# Patient Record
Sex: Female | Born: 1980 | Race: Black or African American | Hispanic: No | Marital: Single | Smoking: Never smoker
Health system: Southern US, Community
[De-identification: ages and names within clinical notes are randomized; demographics above are authoritative.]

## PROBLEM LIST (undated history)

## (undated) DIAGNOSIS — M1990 Unspecified osteoarthritis, unspecified site: Secondary | ICD-10-CM

## (undated) DIAGNOSIS — D649 Anemia, unspecified: Secondary | ICD-10-CM

## (undated) DIAGNOSIS — K219 Gastro-esophageal reflux disease without esophagitis: Secondary | ICD-10-CM

## (undated) DIAGNOSIS — F32A Depression, unspecified: Secondary | ICD-10-CM

## (undated) DIAGNOSIS — F419 Anxiety disorder, unspecified: Secondary | ICD-10-CM

## (undated) DIAGNOSIS — R011 Cardiac murmur, unspecified: Secondary | ICD-10-CM

## (undated) HISTORY — DX: Anemia, unspecified: D64.9

## (undated) HISTORY — DX: Depression, unspecified: F32.A

## (undated) HISTORY — DX: Gastro-esophageal reflux disease without esophagitis: K21.9

## (undated) HISTORY — DX: Cardiac murmur, unspecified: R01.1

## (undated) HISTORY — DX: Unspecified osteoarthritis, unspecified site: M19.90

## (undated) HISTORY — DX: Anxiety disorder, unspecified: F41.9

---
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
## 2016-06-14 HISTORY — PX: WRIST SURGERY: SHX841

## 2019-08-28 ENCOUNTER — Emergency Department (HOSPITAL_BASED_OUTPATIENT_CLINIC_OR_DEPARTMENT_OTHER): Payer: BC Managed Care – PPO

## 2019-08-28 ENCOUNTER — Encounter (HOSPITAL_BASED_OUTPATIENT_CLINIC_OR_DEPARTMENT_OTHER): Payer: Self-pay

## 2019-08-28 ENCOUNTER — Other Ambulatory Visit: Payer: Self-pay

## 2019-08-28 ENCOUNTER — Emergency Department (HOSPITAL_BASED_OUTPATIENT_CLINIC_OR_DEPARTMENT_OTHER)
Admission: EM | Admit: 2019-08-28 | Discharge: 2019-08-28 | Disposition: A | Discharge status: Home / Self Care | Payer: BC Managed Care – PPO | Attending: Emergency Medicine | Admitting: Emergency Medicine

## 2019-08-28 DIAGNOSIS — Y999 Unspecified external cause status: Secondary | ICD-10-CM | POA: Insufficient documentation

## 2019-08-28 DIAGNOSIS — Y939 Activity, unspecified: Secondary | ICD-10-CM | POA: Insufficient documentation

## 2019-08-28 DIAGNOSIS — Y92410 Unspecified street and highway as the place of occurrence of the external cause: Secondary | ICD-10-CM | POA: Insufficient documentation

## 2019-08-28 DIAGNOSIS — M7918 Myalgia, other site: Secondary | ICD-10-CM | POA: Insufficient documentation

## 2019-08-28 DIAGNOSIS — R519 Headache, unspecified: Secondary | ICD-10-CM | POA: Insufficient documentation

## 2019-08-28 DIAGNOSIS — M25512 Pain in left shoulder: Secondary | ICD-10-CM | POA: Diagnosis present

## 2019-08-28 MED ORDER — IBUPROFEN 800 MG PO TABS
800.0000 mg | ORAL_TABLET | Freq: Once | ORAL | Status: AC
  Administered 2019-08-28: 800 mg via ORAL
  Filled 2019-08-28: qty 1

## 2019-08-28 MED ORDER — METHOCARBAMOL 500 MG PO TABS
500.0000 mg | ORAL_TABLET | Freq: Three times a day (TID) | ORAL | 0 refills | Status: AC | PRN
Start: 2019-08-28 — End: ?
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
## 2019-08-28 NOTE — Discharge Instructions (Signed)
Follow up with sports medicine if the pain in the left upper extremity does not improve.
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
## 2021-01-15 DIAGNOSIS — L409 Psoriasis, unspecified: Secondary | ICD-10-CM | POA: Insufficient documentation

## 2021-04-07 ENCOUNTER — Encounter: Payer: Self-pay | Admitting: Physician Assistant

## 2021-04-18 DIAGNOSIS — B009 Herpesviral infection, unspecified: Secondary | ICD-10-CM | POA: Insufficient documentation

## 2021-04-22 ENCOUNTER — Ambulatory Visit: Payer: BC Managed Care – PPO | Admitting: Physician Assistant

## 2021-04-22 ENCOUNTER — Other Ambulatory Visit (INDEPENDENT_AMBULATORY_CARE_PROVIDER_SITE_OTHER): Payer: BC Managed Care – PPO

## 2021-04-22 ENCOUNTER — Encounter: Payer: Self-pay | Admitting: Physician Assistant

## 2021-04-22 VITALS — BP 118/78 | HR 71 | Resp 18 | Ht 62.0 in | Wt 154.2 lb

## 2021-04-22 DIAGNOSIS — R1030 Lower abdominal pain, unspecified: Secondary | ICD-10-CM

## 2021-04-22 DIAGNOSIS — R195 Other fecal abnormalities: Secondary | ICD-10-CM

## 2021-04-22 DIAGNOSIS — D509 Iron deficiency anemia, unspecified: Secondary | ICD-10-CM

## 2021-04-22 LAB — CBC WITH DIFFERENTIAL/PLATELET
Basophils Absolute: 0 10*3/uL (ref 0.0–0.1)
Basophils Relative: 0.6 % (ref 0.0–3.0)
Eosinophils Absolute: 0.1 10*3/uL (ref 0.0–0.7)
Eosinophils Relative: 2.5 % (ref 0.0–5.0)
HCT: 36 % (ref 36.0–46.0)
Hemoglobin: 11.7 g/dL — ABNORMAL LOW (ref 12.0–15.0)
Lymphocytes Relative: 40 % (ref 12.0–46.0)
Lymphs Abs: 1.5 10*3/uL (ref 0.7–4.0)
MCHC: 32.6 g/dL (ref 30.0–36.0)
MCV: 86.1 fl (ref 78.0–100.0)
Monocytes Absolute: 0.3 10*3/uL (ref 0.1–1.0)
Monocytes Relative: 7.4 % (ref 3.0–12.0)
Neutro Abs: 1.8 10*3/uL (ref 1.4–7.7)
Neutrophils Relative %: 49.5 % (ref 43.0–77.0)
Platelets: 309 10*3/uL (ref 150.0–400.0)
RBC: 4.18 Mil/uL (ref 3.87–5.11)
RDW: 15.2 % (ref 11.5–15.5)
WBC: 3.7 10*3/uL — ABNORMAL LOW (ref 4.0–10.5)

## 2021-04-22 LAB — BASIC METABOLIC PANEL
BUN: 11 mg/dL (ref 6–23)
CO2: 32 mEq/L (ref 19–32)
Calcium: 9.2 mg/dL (ref 8.4–10.5)
Chloride: 104 mEq/L (ref 96–112)
Creatinine, Ser: 0.55 mg/dL (ref 0.40–1.20)
GFR: 112.95 mL/min (ref >60.00)
Glucose, Bld: 91 mg/dL (ref 70–99)
Potassium: 3.8 mEq/L (ref 3.5–5.1)
Sodium: 140 mEq/L (ref 135–145)

## 2021-04-22 MED ORDER — OMEPRAZOLE 40 MG PO CPDR: 40.0000 mg | DELAYED_RELEASE_CAPSULE | Freq: Every day | ORAL | 3 refills | Status: DC

## 2021-04-22 MED ORDER — PLENVU 140 G PO SOLR: 1.0000 | ORAL | 0 refills | Status: DC

## 2021-04-22 NOTE — Patient Instructions (Addendum)
If you are age 43 or younger, your body mass index should be between 19-25. Your Body mass index is 28.2 kg/m. If this is out of the aformentioned range listed, please consider follow up with your Primary Care Provider.  ________________________________________________________  The [facility] providers would like to encourage you to use [facility] to communicate with providers for non-urgent requests or questions.  Due to long hold times on the telephone, sending your provider a message by [facility] may be a faster and more efficient way to get a response.  Please allow 48 business hours for a response.  Please remember that this is for non-urgent requests.  _______________________________________________________  Your provider has requested that you go to the basement level for lab work before leaving today. Press "B" on the elevator. The lab is located at the first door on the left as you exit the elevator.  You have been scheduled for an endoscopy and colonoscopy. Please follow the written instructions given to you at your visit today. Please pick up your prep supplies at the pharmacy within the next 1-3 days. If you use inhalers (even only as needed), please bring them with you on the day of your procedure.  You have been scheduled for a CT scan of the abdomen and pelvis at [place] ([address]---this is in the same building as [employer]).   You are scheduled on 05/02/2021 at 8:30 am. You should arrive 15 minutes prior to your appointment time for registration. Please follow the written instructions below on the day of your exam:  WARNING: IF YOU ARE ALLERGIC TO IODINE/X-RAY DYE, PLEASE NOTIFY RADIOLOGY IMMEDIATELY AT [phone]! YOU WILL BE GIVEN A 13 HOUR PREMEDICATION PREP.  1) Do not eat anything after 4:30 am (4 hours prior to your test) 2) You have been given 2 bottles of oral contrast to drink. The solution may taste better if refrigerated, but do NOT add  ice or any other liquid to this solution. Shake well before drinking.    Drink 1 bottle of contrast @ 6:30 am (2 hours prior to your exam)  Drink 1 bottle of contrast @ 7:30 am (1 hour prior to your exam)  You may take any medications as prescribed with a small amount of water, if necessary. If you take any of the following medications: METFORMIN, GLUCOPHAGE, GLUCOVANCE, AVANDAMET, RIOMET, FORTAMET, [place] MET, JANUMET, GLUMETZA or METAGLIP, you MAY be asked to HOLD this medication 48 hours AFTER the exam.  The purpose of you drinking the oral contrast is to aid in the visualization of your intestinal tract. The contrast solution may cause some diarrhea. Depending on your individual set of symptoms, you may also receive an intravenous injection of x-ray contrast/dye. Plan on being at [facility] for 30 minutes or longer, depending on the type of exam you are having performed.  This test typically takes 30-45 minutes to complete.  If you have any questions regarding your exam or if you need to reschedule, you may call the CT department at [phone] between the hours of 8:00 am and 5:00 pm, Monday-Friday.  ____________________________________________________________  START Omeprazole 40 mg 1 capsule before breakfast.  Follow up pending the results of your Ct, Endoscopy/Colonoscopy.  Thank you for entrusting me with your care and choosing [facility].  Amy Esterwood, PA-C

## 2021-04-23 ENCOUNTER — Encounter: Payer: Self-pay | Admitting: Physician Assistant

## 2021-04-23 NOTE — Progress Notes (Signed)
Attending Physician's Attestation   I have reviewed the chart.   I agree with the Advanced Practitioner's note, impression, and recommendations with any updates as below.    Ariann Khaimov Mansouraty, MD [facility] Advanced [facility]oscopy Office # [phone]  

## 2021-04-23 NOTE — Progress Notes (Signed)
Subjective:    Patient ID: Dawn Brooks, female    DOB: [birth year], 41 y.o.   MRN: [id number]  HPI Dawn Brooks is a 41 year old African-American female, new to GI today referred by [facility] for evaluation of iron deficiency anemia and lower abdominal pain. Patient has not had any prior GI evaluation. She says she has been intermittently anemic in the past, perhaps iron deficient since she delivered a child in 2016.  She has not had issues with menorrhagia, and says that her menses have been normal, usually last for 4 to 5 days and are not obviously heavy though perhaps a little bit heavier over the past 5 or 6 years. She had labs done 04/08/2021 showing ferritin of 9/serum iron 69/iron sat 116/TIBC 434 Hemoglobin 11.5/hematocrit 35/MCV of 81. She has started on ferrous sulfate 325 mg twice daily. She is complaining of fatigue over the past couple of months which she says is pretty severe, she has had some episodes of lightheadedness and says she just does not feel well. She has been having mid lower abdominal pain which she describes as a shooting pain at times that feels like it shoots into her pelvis/uterus.  She has noticed perhaps some positional exacerbation with turning etc.  She is describing cramping and spasms over the past 3 weeks intermittently of varying intensity.  She has not noticed any changes with p.o. intake, appetite has been okay, weight stable, no fever or chills. She did have some black stools about a month ago just noted on 1 day and then recurred for 1 other day at which point she says stool was very dark green.  Since then has been having regular bowel movements. She has had a prescription for meloxicam but says she has not been taking that, may take occasional NSAID. Family history negative for GI disease as far she is aware. No recent abdominal imaging  Other medical problems include lumbar spondylosis, she status post C-section.  Review of  Systems Pertinent positive and negative review of systems were noted in the above HPI section.  All other review of systems was otherwise negative.   Outpatient Encounter Medications as of 04/22/2021  Medication Sig   Ascorbic Acid (VITAMIN C) 1000 MG tablet Take 1,000 mg by mouth daily.   Cholecalciferol 25 MCG (1000 UT) tablet Take by mouth.   Cranberry Fruit 465 MG CAPS Take by mouth.   ferrous sulfate 325 (65 FE) MG tablet Take 1 tablet by mouth daily.   lidocaine (LIDODERM) 5 % Place 1 patch onto the skin daily. Remove & Discard patch within 12 hours or as directed by MD   Multiple Vitamin (MULTI-VITAMIN) tablet Take by mouth.   omeprazole (PRILOSEC) 40 MG capsule Take 1 capsule (40 mg total) by mouth daily before breakfast.   PEG-KCl-NaCl-NaSulf-Na Asc-C (PLENVU) 140 g SOLR Take 1 kit by mouth as directed.   doxycycline (DORYX) 100 MG EC tablet Take 100 mg by mouth 2 (two) times daily. (Patient not taking: Reported on 04/22/2021)   Fluocinolone Acetonide Scalp 0.01 % OIL Apply topically as needed.   meloxicam (MOBIC) 15 MG tablet Take 15 mg by mouth daily. (Patient not taking: Reported on 04/22/2021)   methocarbamol (ROBAXIN) 500 MG tablet Take 1 tablet (500 mg total) by mouth every 8 (eight) hours as needed for muscle spasms. (Patient not taking: Reported on 04/22/2021)   norethindrone-ethinyl estradiol (LOESTRIN) 1-20 MG-MCG tablet Take 1 tablet by mouth daily. (Patient not taking: Reported on 04/22/2021)  norethindrone-ethinyl estradiol-FE (LOESTRIN FE) 1-20 MG-MCG tablet Take 1 tablet by mouth daily. (Patient not taking: Reported on 04/22/2021)   valACYclovir (VALTREX) 500 MG tablet Take by mouth. (Patient not taking: Reported on 04/22/2021)   No facility-administered encounter medications on file as of 04/22/2021.   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   Social History   Socioeconomic History    Marital status: Single    Spouse name: Not on file   Number of children: Not on file   Years of education: Not on file   Highest education level: Not on file  Occupational History   Not on file  Tobacco Use   Smoking status: Never   Smokeless tobacco: Never  Vaping Use   Vaping Use: Never used  Substance and Sexual Activity   Alcohol use: Never   Drug use: Not Currently    Types: Marijuana   Sexual activity: Not on file  Other Topics Concern   Not on file  Social History Narrative   Not on file   Social Determinants of Health   Financial Resource Strain: Not on file  Food Insecurity: Not on file  Transportation Needs: Not on file  Physical Activity: Not on file  Stress: Not on file  Social Connections: Not on file  Intimate Partner Violence: Not on file    Dawn Brooks's family history includes Breast cancer in her paternal aunt; Diabetes in her maternal grandfather and maternal grandmother; Prostate cancer in her father.      Objective:    Vitals:   04/22/21 0848  BP: 118/78  Pulse: 71  Resp: 18  SpO2: 99%    Physical Exam Well-developed well-nourished AA in no acute distress.  Height, Weight, 154 BMI 28.2  HEENT; nontraumatic normocephalic, EOMI, PE R LA, sclera anicteric. Oropharynx; not examined today Neck; supple, no JVD Cardiovascular; regular rate and rhythm with S1-S2, no murmur rub or gallop Pulmonary; Clear bilaterally Abdomen; soft, nontender, nondistended, no palpable mass or hepatosplenomegaly, bowel sounds are active Rectal; not done today Skin; benign exam, no jaundice rash or appreciable lesions Extremities; no clubbing cyanosis or edema skin warm and dry Neuro/Psych; alert and oriented x4, grossly nonfocal mood and affect appropriate        Assessment & Plan:   #[age] year old female, with iron deficiency anemia, 3 to 4-week history of lower abdominal pain/cramping and reported history of black stool on 2 occasions about 1 month ago with  normalization since.  History concerning for occult GI blood loss and 1 episode of more overt blood loss about 1 month ago.  Rule out occult colonic neoplasm, IBD, consider chronic gastropathy.  Plan; Patient will be scheduled for colonoscopy and EGD with Dr. Lia Foyer   Both procedures were discussed in detail with the patient including indications risk and benefits and she is agreeable to proceed Will also proceed with CT of the abdomen and pelvis with contrast.  This will be done within the next week, prior to planned procedures. Continue ferrous sulfate 325 mg p.o. twice daily We will add prophylactic omeprazole 40 mg p.o. every morning. Patient asked for a note to be out of work until procedures can be completed.   Gervase Colberg S Telissa Palmisano PA-C 04/23/2021   Cc: Nicola Girt, DO

## 2021-05-01 ENCOUNTER — Emergency Department (HOSPITAL_COMMUNITY)
Admission: EM | Admit: 2021-05-01 | Discharge: 2021-05-02 | Disposition: A | Discharge status: Home / Self Care | Payer: BC Managed Care – PPO | Attending: Emergency Medicine | Admitting: Emergency Medicine

## 2021-05-01 ENCOUNTER — Emergency Department (HOSPITAL_COMMUNITY): Payer: BC Managed Care – PPO

## 2021-05-01 DIAGNOSIS — R202 Paresthesia of skin: Secondary | ICD-10-CM | POA: Insufficient documentation

## 2021-05-01 DIAGNOSIS — R0789 Other chest pain: Secondary | ICD-10-CM | POA: Diagnosis not present

## 2021-05-01 DIAGNOSIS — R079 Chest pain, unspecified: Secondary | ICD-10-CM

## 2021-05-01 DIAGNOSIS — N9489 Other specified conditions associated with female genital organs and menstrual cycle: Secondary | ICD-10-CM | POA: Diagnosis not present

## 2021-05-01 LAB — CBC
HCT: 34.9 % — ABNORMAL LOW (ref 36.0–46.0)
Hemoglobin: 10.9 g/dL — ABNORMAL LOW (ref 12.0–15.0)
MCH: 27.5 pg (ref 26.0–34.0)
MCHC: 31.2 g/dL (ref 30.0–36.0)
MCV: 88.1 fL (ref 80.0–100.0)
Platelets: 293 10*3/uL (ref 150–400)
RBC: 3.96 MIL/uL (ref 3.87–5.11)
RDW: 14 % (ref 11.5–15.5)
WBC: 4.1 10*3/uL (ref 4.0–10.5)
nRBC: 0 % (ref 0.0–0.2)

## 2021-05-01 LAB — BASIC METABOLIC PANEL
Anion gap: 6 (ref 5–15)
BUN: 6 mg/dL (ref 6–20)
CO2: 27 mmol/L (ref 22–32)
Calcium: 8.7 mg/dL — ABNORMAL LOW (ref 8.9–10.3)
Chloride: 105 mmol/L (ref 98–111)
Creatinine, Ser: 0.7 mg/dL (ref 0.44–1.00)
GFR, Estimated: >60 mL/min (ref >60)
Glucose, Bld: 111 mg/dL — ABNORMAL HIGH (ref 70–99)
Potassium: 4 mmol/L (ref 3.5–5.1)
Sodium: 138 mmol/L (ref 135–145)

## 2021-05-01 LAB — I-STAT BETA HCG BLOOD, ED (MC, WL, AP ONLY): I-stat hCG, quantitative: <5 m[IU]/mL (ref <5)

## 2021-05-01 LAB — TROPONIN I (HIGH SENSITIVITY): Troponin I (High Sensitivity): 2 ng/L (ref <18)

## 2021-05-01 NOTE — ED Triage Notes (Signed)
Pt c/o intermittent CP in the R side that has "shifted" to the L side in the last week. Also c/o R hand tingling onset today, states it's started to go up in the arm as the day progressed. Hx "digestive issues," CT, endoscopy, colonoscopy scheduled. Endorses intermittent episodes of dizziness, nausea.

## 2021-05-02 ENCOUNTER — Other Ambulatory Visit: Payer: Self-pay

## 2021-05-02 ENCOUNTER — Ambulatory Visit (INDEPENDENT_AMBULATORY_CARE_PROVIDER_SITE_OTHER)
Admission: RE | Admit: 2021-05-02 | Discharge: 2021-05-02 | Disposition: A | Discharge status: Home / Self Care | Payer: BC Managed Care – PPO | Source: Ambulatory Visit | Attending: Physician Assistant | Admitting: Physician Assistant

## 2021-05-02 DIAGNOSIS — D509 Iron deficiency anemia, unspecified: Secondary | ICD-10-CM | POA: Diagnosis not present

## 2021-05-02 DIAGNOSIS — R195 Other fecal abnormalities: Secondary | ICD-10-CM | POA: Diagnosis not present

## 2021-05-02 DIAGNOSIS — R1030 Lower abdominal pain, unspecified: Secondary | ICD-10-CM

## 2021-05-02 LAB — TROPONIN I (HIGH SENSITIVITY): Troponin I (High Sensitivity): 3 ng/L (ref <18)

## 2021-05-02 MED ORDER — IOHEXOL 300 MG/ML  SOLN
100.0000 mL | Freq: Once | INTRAMUSCULAR | Status: AC | PRN
  Administered 2021-05-02: 100 mL via INTRAVENOUS

## 2021-05-02 NOTE — ED Provider Notes (Signed)
[facility] EMERGENCY DEPARTMENT Provider Note   CSN: [id number] Arrival date & time: 05/01/21  2143     History  Chief Complaint  Patient presents with   Chest Pain    R sided    Dawn Brooks is a 41 y.o. female. The patient presents to the ED with complaints of left sided chest pain and right hand paresthesias. The patient states that she has had right sided chest pain for the past 1.5 months. She states that earlier this week the pain migrated to the left side of her chest. Today she experienced some pain and tingling in her right hand. She was concerned about possible cardiac issues due to the right hand involvement. PMH significant for iron deficiency anemia, de Quervain's tendinitis (right wrist) with release in 2018, DDD (lumbar).    HPI     Home Medications Prior to Admission medications   Medication Sig Start Date End Date Taking? Authorizing Provider  Ascorbic Acid (VITAMIN C) 1000 MG tablet Take 1,000 mg by mouth daily.   Yes [provider]  Cholecalciferol 25 MCG (1000 UT) tablet Take 1,000 Units by mouth daily.   Yes [provider]  Cranberry Fruit 465 MG CAPS Take 465 mg by mouth daily.   Yes [provider]  ferrous sulfate 325 (65 FE) MG tablet Take 325 mg by mouth 2 (two) times daily with a meal.   Yes [provider]  Fluocinolone Acetonide Scalp 0.01 % OIL Apply 1 application topically as needed (itching). 12/18/20  Yes [provider]  ibuprofen (ADVIL) 200 MG tablet Take 400 mg by mouth every 6 (six) hours as needed for headache or moderate pain.   Yes [provider]  lidocaine (LIDODERM) 5 % Place 1 patch onto the skin daily as needed (pain). Remove & Discard patch within 12 hours or as directed by MD   Yes [provider]  Multiple Vitamin (MULTI-VITAMIN) tablet Take 1 tablet by mouth daily.   Yes [provider]  omeprazole (PRILOSEC) 40 MG capsule Take 1 capsule (40  mg total) by mouth daily before breakfast. 04/22/21  Yes Esterwood, Amy S, PA-C  valACYclovir (VALTREX) 500 MG tablet Take 500 mg by mouth daily as needed (outbreaks). 04/20/16  Yes [provider]  methocarbamol (ROBAXIN) 500 MG tablet Take 1 tablet (500 mg total) by mouth every 8 (eight) hours as needed for muscle spasms. Patient not taking: Reported on 04/22/2021 08/28/19   Davonna Belling, MD  PEG-KCl-NaCl-NaSulf-Na Asc-C (PLENVU) 140 g SOLR Take 1 kit by mouth as directed. 04/22/21   Esterwood, Amy S, PA-C      Allergies    Patient has no known allergies.    Review of Systems   Review of Systems  Constitutional:  Negative for fever.  Respiratory:  Negative for shortness of breath.   Cardiovascular:  Positive for chest pain. Negative for palpitations.  Gastrointestinal:  Negative for abdominal pain, nausea and vomiting.  Musculoskeletal:        Right hand pain  Neurological:  Positive for numbness (Intermittent right hand numbness).   Physical Exam Updated Vital Signs BP 116/66 (BP Location: Right Arm)    Pulse 70    Temp 98.9 F (37.2 C) (Oral)    Resp 18    LMP 04/18/2021    SpO2 97%  Physical Exam Constitutional:      General: She is not in acute distress. HENT:     Head: Normocephalic.  Cardiovascular:  Rate and Rhythm: Normal rate and regular rhythm.     Heart sounds: Normal heart sounds.  Pulmonary:     Effort: Pulmonary effort is normal. No respiratory distress.     Breath sounds: Normal breath sounds.  Chest:     Chest wall: No deformity, tenderness or crepitus.  Abdominal:     Palpations: Abdomen is soft.     Tenderness: There is no abdominal tenderness.  Musculoskeletal:     Right hand: Normal strength.     Cervical back: Normal range of motion.     Comments: Intermittent paresthesias right hand  Skin:    General: Skin is warm and dry.     Coloration: Skin is not pale.  Neurological:     Mental Status: She is alert.    ED Results / Procedures /  Treatments   Labs (all labs ordered are listed, but only abnormal results are displayed) Labs Reviewed  BASIC METABOLIC PANEL - Abnormal; Notable for the following components:      Result Value   Glucose, Bld 111 (*)    Calcium 8.7 (*)    All other components within normal limits  CBC - Abnormal; Notable for the following components:   Hemoglobin 10.9 (*)    HCT 34.9 (*)    All other components within normal limits  I-STAT BETA HCG BLOOD, ED ([facility], [facility], [facility] ONLY)  TROPONIN I (HIGH SENSITIVITY)  TROPONIN I (HIGH SENSITIVITY)    EKG EKG Interpretation  Date/Time:  Thursday May 01 2021 22:08:13 EST Ventricular Rate:  82 PR Interval:  146 QRS Duration: 84 QT Interval:  374 QTC Calculation: 436 R Axis:   25 Text Interpretation: Normal sinus rhythm Normal ECG No previous ECGs available Confirmed by Addison Lank [phone]) on 05/02/2021 3:05:34 AM  Radiology DG Chest 2 View  Result Date: 05/01/2021 CLINICAL DATA:  Chest pain. EXAM: CHEST - 2 VIEW COMPARISON:  None. FINDINGS: The heart size and mediastinal contours are within normal limits. Mild airspace disease is noted in the infrahilar region on the right. No consolidation, effusion, or pneumothorax. No acute osseous abnormality. IMPRESSION: Mild infrahilar airspace disease on the right, possible atelectasis versus developing infiltrate. Electronically Signed   By: Brett Fairy M.D.   On: 05/01/2021 22:26    Procedures Procedures    Medications Ordered in ED Medications - No data to display  ED Course/ Medical Decision Making/ A&P                           Medical Decision Making Amount and/or Complexity of Data Reviewed Labs: ordered. Radiology: ordered.   This patient presents to the ED for concern of left sided chest pain, this involves an extensive number of treatment options, and is a complaint that carries with it a high risk of complications and morbidity.  The differential diagnosis includes but is not limited to  ACS, dissection, PE, cholecystitis, myocarditis, biliary colic, pleuritis, muscle sprain   Co morbidities that complicate the patient evaluation  Iron deficiency anemia   Additional history obtained:   External records from outside source obtained and reviewed including office note from 04/22/2021   Lab Tests:  I Ordered, and personally interpreted labs.  The pertinent results include:  Troponin 3.    Imaging Studies ordered:  I ordered imaging studies including chest x-ray  I independently visualized and interpreted imaging which showed mild infrahilar airspace disease on the right I agree with the radiologist interpretation  Cardiac Monitoring:  The patient was maintained on a cardiac monitor.  I personally viewed and interpreted the cardiac monitored which showed an underlying rhythm of: Normal sinus rhythm   Medicines ordered and prescription drug management:   I have reviewed the patients home medicines and have made adjustments as needed   Reevaluation:  After the interventions noted above, I reevaluated the patient and found that they have :improved    Dispostion:  After consideration of the diagnostic results and the patients response to treatment, I feel that the patent would benefit from discharge home. Troponin of 3 shows no sign of heart stress. EKG shows no ischemic changes. The patient states that the chest pain is positional which makes ACS highly unlikely. She is having no shortness of breath and is not tachycardic. I believe that the chest pain is likely musculoskeletal in nature. Strict return precautions provided. I recommend follow up with the patient's PCP.  The patient voiced understanding   Final Clinical Impression(s) / ED Diagnoses Final diagnoses:  Chest pain, unspecified type    Rx / DC Orders ED Discharge Orders     None         Dorothyann Peng, [place] 05/02/21 0533    Fatima Blank, MD 05/02/21 [phone]

## 2021-05-02 NOTE — Discharge Instructions (Addendum)
You were seen today for chest pain.  Lab values showed no signs of heart injury.  Your EKG showed no signs of ischemia or damage to your heart.  I recommend follow-up with your primary care provider for your continued chest wall pain.  You should return to the emergency department if you have increasing chest pain, shortness of breath, altered level of consciousness, or other life threats.

## 2021-05-08 ENCOUNTER — Ambulatory Visit (AMBULATORY_SURGERY_CENTER): Payer: BC Managed Care – PPO | Admitting: Gastroenterology

## 2021-05-08 ENCOUNTER — Other Ambulatory Visit: Payer: Self-pay

## 2021-05-08 ENCOUNTER — Encounter: Payer: Self-pay | Admitting: Gastroenterology

## 2021-05-08 VITALS — BP 114/69 | HR 77 | Temp 97.9°F | Resp 13 | Ht 62.0 in | Wt 154.0 lb

## 2021-05-08 DIAGNOSIS — R195 Other fecal abnormalities: Secondary | ICD-10-CM

## 2021-05-08 DIAGNOSIS — K640 First degree hemorrhoids: Secondary | ICD-10-CM | POA: Diagnosis not present

## 2021-05-08 DIAGNOSIS — K5730 Diverticulosis of large intestine without perforation or abscess without bleeding: Secondary | ICD-10-CM | POA: Diagnosis not present

## 2021-05-08 DIAGNOSIS — K2970 Gastritis, unspecified, without bleeding: Secondary | ICD-10-CM

## 2021-05-08 DIAGNOSIS — K317 Polyp of stomach and duodenum: Secondary | ICD-10-CM | POA: Diagnosis not present

## 2021-05-08 DIAGNOSIS — R1030 Lower abdominal pain, unspecified: Secondary | ICD-10-CM | POA: Diagnosis not present

## 2021-05-08 DIAGNOSIS — D509 Iron deficiency anemia, unspecified: Secondary | ICD-10-CM

## 2021-05-08 DIAGNOSIS — R12 Heartburn: Secondary | ICD-10-CM

## 2021-05-08 DIAGNOSIS — K449 Diaphragmatic hernia without obstruction or gangrene: Secondary | ICD-10-CM | POA: Diagnosis not present

## 2021-05-08 DIAGNOSIS — K635 Polyp of colon: Secondary | ICD-10-CM | POA: Diagnosis not present

## 2021-05-08 DIAGNOSIS — D125 Benign neoplasm of sigmoid colon: Secondary | ICD-10-CM

## 2021-05-08 DIAGNOSIS — D123 Benign neoplasm of transverse colon: Secondary | ICD-10-CM

## 2021-05-08 DIAGNOSIS — D122 Benign neoplasm of ascending colon: Secondary | ICD-10-CM

## 2021-05-08 MED ORDER — SODIUM CHLORIDE 0.9 % IV SOLN: 500.0000 mL | Freq: Once | INTRAVENOUS | Status: DC

## 2021-05-08 NOTE — Progress Notes (Signed)
Called to room to assist during endoscopic procedure.  Patient ID and intended procedure confirmed with present staff. Received instructions for my participation in the procedure from the performing physician.  

## 2021-05-08 NOTE — Op Note (Signed)
[facility] Patient Name: Dawn Brooks Procedure Date: 05/08/2021 2:46 PM MRN: [id number] Endoscopist: Justice Britain , MD Age: 41 Referring MD:  Date of Birth: [birth year] Gender: Female Account #: [id number] Procedure:                Colonoscopy Indications:              Lower abdominal pain, Iron deficiency anemia Medicines:                Monitored Anesthesia Care Procedure:                Pre-Anesthesia Assessment:                           - Prior to the procedure, a History and Physical                            was performed, and patient medications and                            allergies were reviewed. The patient's tolerance of                            previous anesthesia was also reviewed. The risks                            and benefits of the procedure and the sedation                            options and risks were discussed with the patient.                            All questions were answered, and informed consent                            was obtained. Prior Anticoagulants: The patient has                            taken no previous anticoagulant or antiplatelet                            agents. ASA Grade Assessment: II - A patient with                            mild systemic disease. After reviewing the risks                            and benefits, the patient was deemed in                            satisfactory condition to undergo the procedure.                           After obtaining informed consent, the colonoscope  was passed under direct vision. Throughout the                            procedure, the patient's blood pressure, pulse, and                            oxygen saturations were monitored continuously. The                            Olympus PCF-H190DL (MW#[id number]) Colonoscope was                            introduced through the anus and advanced to the 5                            cm into the  ileum. The colonoscopy was performed                            without difficulty. The patient tolerated the                            procedure. The quality of the bowel preparation was                            good. The terminal ileum, ileocecal valve,                            appendiceal orifice, and rectum were photographed. Scope In: 3:06:55 PM Scope Out: 3:21:19 PM Scope Withdrawal Time: 0 hours 12 minutes 6 seconds  Total Procedure Duration: 0 hours 14 minutes 24 seconds  Findings:                 The digital rectal exam was normal. Pertinent                            negatives include no palpable rectal lesions.                           The terminal ileum and ileocecal valve appeared                            normal.                           Four sessile polyps were found in the sigmoid colon                            (1), transverse colon (2) and ascending colon (1).                            The polyps were 2 to 9 mm in size. These polyps                            were removed with a cold snare. Resection  and                            retrieval were complete.                           A single small-mouthed diverticulum was found in                            the cecum.                           Normal mucosa was found in the entire colon                            otherwise.                           Non-bleeding non-thrombosed internal hemorrhoids                            were found during retroflexion, during perianal                            exam and during digital exam. The hemorrhoids were                            Grade I (internal hemorrhoids that do not prolapse). Complications:            No immediate complications. Estimated Blood Loss:     Estimated blood loss was minimal. Impression:               - The examined portion of the ileum was normal.                           - Four 2 to 9 mm polyps in the sigmoid colon, in                             the transverse colon and in the ascending colon,                            removed with a cold snare. Resected and retrieved.                           - Diverticulosis in the cecum.                           - Normal mucosa in the entire examined colon                            otherwise.                           - Non-bleeding non-thrombosed internal hemorrhoids. Recommendation:           - The patient will be observed post-procedure,  until all discharge criteria are met.                           - Discharge patient to home.                           - Patient has a contact number available for                            emergencies. The signs and symptoms of potential                            delayed complications were discussed with the                            patient. Return to normal activities tomorrow.                            Written discharge instructions were provided to the                            patient.                           - High fiber diet.                           - Use FiberCon 1-2 tablets PO daily.                           - Continue present medications.                           - Await pathology results.                           - Repeat colonoscopy in 3/07/20/08 years for                            surveillance based on pathology results.                           - As patient is still having menstruation, IDA                            likely from this. If IDA persists after                            menstruation completes or if it becomes                            significantly progressive, can consider Video                            Capsule Endoscopy in future.                           -  The findings and recommendations were discussed                            with the patient.                           - The findings and recommendations were discussed                            with the patient's  family. Justice Britain, MD 05/08/2021 3:34:44 PM

## 2021-05-08 NOTE — Progress Notes (Signed)
Pt awake, report to RN, [facility]  °

## 2021-05-08 NOTE — Progress Notes (Signed)
GASTROENTEROLOGY PROCEDURE H&P NOTE   Primary Care Physician: Nicola Girt, DO  HPI: Dawn Brooks is a 41 y.o. female who presents for EGD/Colonoscopy to evaluation lower abdominal pain, IDA, dark stools.  Past Medical History:  Diagnosis Date   Anemia    Anxiety    Arthritis    Depression    GERD (gastroesophageal reflux disease)    Heart murmur    Past Surgical History:  Procedure Laterality Date   CESAREAN SECTION  2016   WISDOM TOOTH EXTRACTION  1998   WRIST SURGERY  06/14/2016   Current Outpatient Medications  Medication Sig Dispense Refill   Ascorbic Acid (VITAMIN C) 1000 MG tablet Take 1,000 mg by mouth daily.     Cholecalciferol 25 MCG (1000 UT) tablet Take 1,000 Units by mouth daily.     Cranberry Fruit 465 MG CAPS Take 465 mg by mouth daily.     ferrous sulfate 325 (65 FE) MG tablet Take 325 mg by mouth 2 (two) times daily with a meal.     Fluocinolone Acetonide Scalp 0.01 % OIL Apply 1 application topically as needed (itching).     lidocaine (LIDODERM) 5 % Place 1 patch onto the skin daily as needed (pain). Remove & Discard patch within 12 hours or as directed by MD     Multiple Vitamin (MULTI-VITAMIN) tablet Take 1 tablet by mouth daily.     omeprazole (PRILOSEC) 40 MG capsule Take 1 capsule (40 mg total) by mouth daily before breakfast. 30 capsule 3   valACYclovir (VALTREX) 500 MG tablet Take 500 mg by mouth daily as needed (outbreaks).     ibuprofen (ADVIL) 200 MG tablet Take 400 mg by mouth every 6 (six) hours as needed for headache or moderate pain.     methocarbamol (ROBAXIN) 500 MG tablet Take 1 tablet (500 mg total) by mouth every 8 (eight) hours as needed for muscle spasms. (Patient not taking: Reported on 04/22/2021) 8 tablet 0   Current Facility-Administered Medications  Medication Dose Route Frequency Provider Last Rate Last Admin   0.9 %  sodium chloride infusion  500 mL Intravenous Once Mansouraty, Telford Nab., MD        Current Outpatient  Medications:    Ascorbic Acid (VITAMIN C) 1000 MG tablet, Take 1,000 mg by mouth daily., Disp: , Rfl:    Cholecalciferol 25 MCG (1000 UT) tablet, Take 1,000 Units by mouth daily., Disp: , Rfl:    Cranberry Fruit 465 MG CAPS, Take 465 mg by mouth daily., Disp: , Rfl:    ferrous sulfate 325 (65 FE) MG tablet, Take 325 mg by mouth 2 (two) times daily with a meal., Disp: , Rfl:    Fluocinolone Acetonide Scalp 0.01 % OIL, Apply 1 application topically as needed (itching)., Disp: , Rfl:    lidocaine (LIDODERM) 5 %, Place 1 patch onto the skin daily as needed (pain). Remove & Discard patch within 12 hours or as directed by MD, Disp: , Rfl:    Multiple Vitamin (MULTI-VITAMIN) tablet, Take 1 tablet by mouth daily., Disp: , Rfl:    omeprazole (PRILOSEC) 40 MG capsule, Take 1 capsule (40 mg total) by mouth daily before breakfast., Disp: 30 capsule, Rfl: 3   valACYclovir (VALTREX) 500 MG tablet, Take 500 mg by mouth daily as needed (outbreaks)., Disp: , Rfl:    ibuprofen (ADVIL) 200 MG tablet, Take 400 mg by mouth every 6 (six) hours as needed for headache or moderate pain., Disp: , Rfl:    methocarbamol (  ROBAXIN) 500 MG tablet, Take 1 tablet (500 mg total) by mouth every 8 (eight) hours as needed for muscle spasms. (Patient not taking: Reported on 04/22/2021), Disp: 8 tablet, Rfl: 0  Current Facility-Administered Medications:    0.9 %  sodium chloride infusion, 500 mL, Intravenous, Once, Mansouraty, Telford Nab., MD No Known Allergies Family History  Problem Relation Age of Onset   Prostate cancer Father    Breast cancer Paternal Aunt    Diabetes Maternal Grandmother    Diabetes Maternal Grandfather    Colon cancer Neg Hx    Esophageal cancer Neg Hx    Liver cancer Neg Hx    Pancreatic cancer Neg Hx    Stomach cancer Neg Hx    Social History   Socioeconomic History   Marital status: Single    Spouse name: Not on file   Number of children: Not on file   Years of education: Not on file   Highest  education level: Not on file  Occupational History   Not on file  Tobacco Use   Smoking status: Never   Smokeless tobacco: Never  Vaping Use   Vaping Use: Never used  Substance and Sexual Activity   Alcohol use: Never   Drug use: Not Currently    Types: Marijuana   Sexual activity: Not on file  Other Topics Concern   Not on file  Social History Narrative   Not on file   Social Determinants of Health   Financial Resource Strain: Not on file  Food Insecurity: Not on file  Transportation Needs: Not on file  Physical Activity: Not on file  Stress: Not on file  Social Connections: Not on file  Intimate Partner Violence: Not on file    Physical Exam: Today's Vitals   05/08/21 1415  BP: (!) 158/78  Pulse: 76  Temp: 97.9 F (36.6 C)  SpO2: 98%  Weight: 154 lb (69.9 kg)  Height: 5\' 2"  (1.575 m)   Body mass index is 28.17 kg/m. GEN: NAD EYE: Sclerae anicteric ENT: MMM CV: Non-tachycardic GI: Soft, NT/ND NEURO:  Alert & Oriented x 3  Lab Results: No results for input(s): WBC, HGB, HCT, PLT in the last 72 hours. BMET No results for input(s): NA, K, CL, CO2, GLUCOSE, BUN, CREATININE, CALCIUM in the last 72 hours. LFT No results for input(s): PROT, ALBUMIN, AST, ALT, ALKPHOS, BILITOT, BILIDIR, IBILI in the last 72 hours. PT/INR No results for input(s): LABPROT, INR in the last 72 hours.   Impression / Plan: This is a 41 y.o.female who presents for EGD/Colonoscopy to evaluation lower abdominal pain, IDA, dark stools.  The risks and benefits of endoscopic evaluation/treatment were discussed with the patient and/or family; these include but are not limited to the risk of perforation, infection, bleeding, missed lesions, lack of diagnosis, severe illness requiring hospitalization, as well as anesthesia and sedation related illnesses.  The patient's history has been reviewed, patient examined, no change in status, and deemed stable for procedure.  The patient and/or family  is agreeable to proceed.    Justice Britain, MD [facility] Advanced [facility]oscopy Office # [phone]

## 2021-05-08 NOTE — Patient Instructions (Signed)
Handouts on heartburn/hiatal hernia/GERD, polyps, hemorrhoids, and diverticulosis given.  High fiber diet.  May use Fibercon 1-2 tablets daily (fiber supplement and is over the counter).  Await pathology results.    YOU HAD AN ENDOSCOPIC PROCEDURE TODAY AT [facility]:   Refer to the procedure report that was given to you for any specific questions about what was found during the examination.  If the procedure report does not answer your questions, please call your gastroenterologist to clarify.  If you requested that your care partner not be given the details of your procedure findings, then the procedure report has been included in a sealed envelope for you to review at your convenience later.  YOU SHOULD EXPECT: Some feelings of bloating in the abdomen. Passage of more gas than usual.  Walking can help get rid of the air that was put into your GI tract during the procedure and reduce the bloating. If you had a lower endoscopy (such as a colonoscopy or flexible sigmoidoscopy) you may notice spotting of blood in your stool or on the toilet paper. If you underwent a bowel prep for your procedure, you may not have a normal bowel movement for a few days.  Please Note:  You might notice some irritation and congestion in your nose or some drainage.  This is from the oxygen used during your procedure.  There is no need for concern and it should clear up in a day or so.  SYMPTOMS TO REPORT IMMEDIATELY:  Following lower endoscopy (colonoscopy or flexible sigmoidoscopy):  Excessive amounts of blood in the stool  Significant tenderness or worsening of abdominal pains  Swelling of the abdomen that is new, acute  Fever of 100F or higher  Following upper endoscopy (EGD)  Vomiting of blood or coffee ground material  New chest pain or pain under the shoulder blades  Painful or persistently difficult swallowing  New shortness of breath  Fever of 100F or higher  Black, tarry-looking  stools  For urgent or emergent issues, a gastroenterologist can be reached at any hour by calling [phone]. Do not use MyChart messaging for urgent concerns.    DIET:  We do recommend a small meal at first, but then you may proceed to your regular diet.  Drink plenty of fluids but you should avoid alcoholic beverages for 24 hours.  ACTIVITY:  You should plan to take it easy for the rest of today and you should NOT DRIVE or use heavy machinery until tomorrow (because of the sedation medicines used during the test).    FOLLOW UP: Our staff will call the number listed on your records 48-72 hours following your procedure to check on you and address any questions or concerns that you may have regarding the information given to you following your procedure. If we do not reach you, we will leave a message.  We will attempt to reach you two times.  During this call, we will ask if you have developed any symptoms of COVID 19. If you develop any symptoms (ie: fever, flu-like symptoms, shortness of breath, cough etc.) before then, please call [phone].  If you test positive for Covid 19 in the 2 weeks post procedure, please call and report this information to [place].    If any biopsies were taken you will be contacted by phone or by letter within the next 1-3 weeks.  Please call us at [phone] if you have not heard about the biopsies in 3 weeks.  SIGNATURES/CONFIDENTIALITY: You and/or your care partner have signed paperwork which will be entered into your electronic medical record.  These signatures attest to the fact that that the information above on your After Visit Summary has been reviewed and is understood.  Full responsibility of the confidentiality of this discharge information lies with you and/or your care-partner.

## 2021-05-08 NOTE — Op Note (Signed)
[facility] Patient Name: Dawn Brooks Procedure Date: 05/08/2021 2:48 PM MRN: [id number] Endoscopist: Justice Britain , MD Age: 41 Referring MD:  Date of Birth: 1981/03/09 Gender: Female Account #: [id number] Procedure:                Upper GI endoscopy Indications:              Lower abdominal pain, Iron deficiency anemia,                            Heartburn Medicines:                Monitored Anesthesia Care Procedure:                Pre-Anesthesia Assessment:                           - Prior to the procedure, a History and Physical                            was performed, and patient medications and                            allergies were reviewed. The patient's tolerance of                            previous anesthesia was also reviewed. The risks                            and benefits of the procedure and the sedation                            options and risks were discussed with the patient.                            All questions were answered, and informed consent                            was obtained. Prior Anticoagulants: The patient has                            taken no previous anticoagulant or antiplatelet                            agents. ASA Grade Assessment: II - A patient with                            mild systemic disease. After reviewing the risks                            and benefits, the patient was deemed in                            satisfactory condition to undergo the procedure.  After obtaining informed consent, the endoscope was                            passed under direct vision. Throughout the                            procedure, the patient's blood pressure, pulse, and                            oxygen saturations were monitored continuously. The                            GIF [id number] #[id number] was introduced through the                            mouth, and advanced to the second part of  duodenum.                            The upper GI endoscopy was accomplished without                            difficulty. The patient tolerated the procedure. Scope In: Scope Out: Findings:                 White nummular lesions were noted in the proximal                            esophagus. Biopsies were taken with a cold forceps                            for histology.                           No other gross lesions were noted in the mid                            esophagus and in the distal esophagus.                           The Z-line was regular and was found 38 cm from the                            incisors.                           A 1 cm hiatal hernia was present.                           A single 5 mm sessile polyp was found in the                            gastric fundus. The polyp was removed with a cold  biopsy forceps. Resection and retrieval were                            complete.                           Patchy mildly erythematous mucosa without bleeding                            was found in the gastric body.                           No other gross lesions were noted in the entire                            examined stomach. Biopsies were taken with a cold                            forceps for histology and Helicobacter pylori                            testing.                           No gross lesions were noted in the duodenal bulb,                            in the first portion of the duodenum and in the                            second portion of the duodenum. Biopsies were taken                            with a cold forceps for histology. Complications:            No immediate complications. Estimated Blood Loss:     Estimated blood loss was minimal. Impression:               - White nummular lesions in esophageal mucosa                            proximally - biopsied. No other gross lesions in                             esophagus. Z-line regular, 38 cm from the incisors.                           - 1 cm hiatal hernia.                           - A single gastric polyp. Resected and retrieved.                           - Erythematous mucosa in the gastric body. No other  gross lesions in the stomach. Biopsied.                           - No gross lesions in the duodenal bulb, in the                            first portion of the duodenum and in the second                            portion of the duodenum. Biopsied. Recommendation:           - Proceed to scheduled colonoscopy.                           - Continue present medications.                           - Await pathology results.                           - The findings and recommendations were discussed                            with the patient.                           - The findings and recommendations were discussed                            with the patient's family. Justice Britain, MD 05/08/2021 3:30:16 PM

## 2021-05-12 ENCOUNTER — Telehealth: Payer: Self-pay

## 2021-05-12 ENCOUNTER — Telehealth: Payer: Self-pay | Admitting: *Deleted

## 2021-05-12 NOTE — Telephone Encounter (Signed)
No answer; voicemail left.

## 2021-05-12 NOTE — Telephone Encounter (Signed)
°  Follow up Call-  Call back number 05/08/2021  Post procedure Call Back phone  # [phone]  Permission to leave phone message Yes  Some recent data might be hidden     Patient questions:  Do you have a fever, pain , or abdominal swelling? No. Pain Score  0 *  Have you tolerated food without any problems? Yes.    Have you been able to return to your normal activities? Yes.    Do you have any questions about your discharge instructions: Diet   No. Medications  No. Follow up visit  No.  Do you have questions or concerns about your Care? No.  Actions: * If pain score is 4 or above: No action needed, pain <4.  Have you developed a fever since your procedure? no  2.   Have you had an respiratory symptoms (SOB or cough) since your procedure? no  3.   Have you tested positive for COVID 19 since your procedure no  4.   Have you had any family members/close contacts diagnosed with the COVID 19 since your procedure?  no   If yes to any of these questions please route to Joylene John, RN and Joella Prince, RN

## 2021-05-13 ENCOUNTER — Encounter: Payer: Self-pay | Admitting: Gastroenterology

## 2021-07-20 ENCOUNTER — Other Ambulatory Visit: Payer: Self-pay | Admitting: Physician Assistant

## 2021-10-14 ENCOUNTER — Telehealth: Payer: Self-pay

## 2021-10-14 NOTE — Telephone Encounter (Signed)
Dr Erenest Blank calling to discuss patient peer to peer regarding a disability claim.

## 2022-02-18 ENCOUNTER — Other Ambulatory Visit (INDEPENDENT_AMBULATORY_CARE_PROVIDER_SITE_OTHER): Payer: BC Managed Care – PPO

## 2022-02-18 ENCOUNTER — Encounter: Payer: Self-pay | Admitting: Physician Assistant

## 2022-02-18 ENCOUNTER — Ambulatory Visit (INDEPENDENT_AMBULATORY_CARE_PROVIDER_SITE_OTHER): Payer: BC Managed Care – PPO | Admitting: Physician Assistant

## 2022-02-18 VITALS — BP 120/76 | HR 72 | Ht 60.0 in | Wt 155.0 lb

## 2022-02-18 DIAGNOSIS — Z86010 Personal history of colonic polyps: Secondary | ICD-10-CM | POA: Diagnosis not present

## 2022-02-18 DIAGNOSIS — K449 Diaphragmatic hernia without obstruction or gangrene: Secondary | ICD-10-CM | POA: Diagnosis not present

## 2022-02-18 DIAGNOSIS — D123 Benign neoplasm of transverse colon: Secondary | ICD-10-CM | POA: Diagnosis not present

## 2022-02-18 DIAGNOSIS — R1084 Generalized abdominal pain: Secondary | ICD-10-CM

## 2022-02-18 DIAGNOSIS — D509 Iron deficiency anemia, unspecified: Secondary | ICD-10-CM | POA: Diagnosis not present

## 2022-02-18 LAB — CBC WITH DIFFERENTIAL/PLATELET
Basophils Absolute: 0 10*3/uL (ref 0.0–0.1)
Basophils Relative: 0.4 % (ref 0.0–3.0)
Eosinophils Absolute: 0.1 10*3/uL (ref 0.0–0.7)
Eosinophils Relative: 1.2 % (ref 0.0–5.0)
HCT: 35.9 % — ABNORMAL LOW (ref 36.0–46.0)
Hemoglobin: 12.3 g/dL (ref 12.0–15.0)
Lymphocytes Relative: 28.3 % (ref 12.0–46.0)
Lymphs Abs: 1.4 10*3/uL (ref 0.7–4.0)
MCHC: 34.3 g/dL (ref 30.0–36.0)
MCV: 87.3 fl (ref 78.0–100.0)
Monocytes Absolute: 0.4 10*3/uL (ref 0.1–1.0)
Monocytes Relative: 8.2 % (ref 3.0–12.0)
Neutro Abs: 3.1 10*3/uL (ref 1.4–7.7)
Neutrophils Relative %: 61.9 % (ref 43.0–77.0)
Platelets: 365 10*3/uL (ref 150.0–400.0)
RBC: 4.11 Mil/uL (ref 3.87–5.11)
RDW: 13.9 % (ref 11.5–15.5)
WBC: 5 10*3/uL (ref 4.0–10.5)

## 2022-02-18 LAB — COMPREHENSIVE METABOLIC PANEL
ALT: 12 U/L (ref 0–35)
AST: 11 U/L (ref 0–37)
Albumin: 4.1 g/dL (ref 3.5–5.2)
Alkaline Phosphatase: 46 U/L (ref 39–117)
BUN: 8 mg/dL (ref 6–23)
CO2: 28 mEq/L (ref 19–32)
Calcium: 8.8 mg/dL (ref 8.4–10.5)
Chloride: 103 mEq/L (ref 96–112)
Creatinine, Ser: 0.63 mg/dL (ref 0.40–1.20)
GFR: 108.68 mL/min (ref >60.00)
Glucose, Bld: 101 mg/dL — ABNORMAL HIGH (ref 70–99)
Potassium: 3.5 mEq/L (ref 3.5–5.1)
Sodium: 137 mEq/L (ref 135–145)
Total Bilirubin: 0.2 mg/dL (ref 0.2–1.2)
Total Protein: 6.9 g/dL (ref 6.0–8.3)

## 2022-02-18 LAB — SEDIMENTATION RATE: Sed Rate: 29 mm/hr — ABNORMAL HIGH (ref 0–20)

## 2022-02-18 LAB — TSH: TSH: 1.45 u[IU]/mL (ref 0.35–5.50)

## 2022-02-18 MED ORDER — FAMOTIDINE 40 MG PO TABS: 40.0000 mg | ORAL_TABLET | Freq: Every day | ORAL | 2 refills | Status: DC

## 2022-02-18 NOTE — Progress Notes (Signed)
Attending Physician's Attestation   I have reviewed the chart.   I agree with the Advanced Practitioner's note, impression, and recommendations with any updates as below.    Mehar Kirkwood Mansouraty, MD [facility] Advanced [facility]oscopy Office # [phone]  

## 2022-02-18 NOTE — Patient Instructions (Signed)
Your provider has requested that you go to the basement level for lab work before leaving today. Press "B" on the elevator. The lab is located at the first door on the left as you exit the elevator.  You have been scheduled for an abdominal ultrasound at [facility] (1st floor of hospital) on 02-25-2022 at [place]. Please arrive 30 minutes prior to your appointment for registration. Make certain not to have anything to eat or drink 6 hours prior to your appointment. Should you need to reschedule your appointment, please contact radiology at [phone]. This test typically takes about 30 minutes to perform.   Due to recent changes in healthcare laws, you may see the results of your imaging and laboratory studies on MyChart before your provider has had a chance to review them.  We understand that in some cases there may be results that are confusing or concerning to you. Not all laboratory results come back in the same time frame and the provider may be waiting for multiple results in order to interpret others.  Please give [place] 48 hours in order for your provider to thoroughly review all the results before contacting the office for clarification of your results.     Please take your proton pump inhibitor medication, pepcid 40 mg daily Avoid spicy and acidic foods Avoid fatty foods Limit your intake of coffee, tea, alcohol, and carbonated drinks Work to maintain a healthy weight Keep the head of the bed elevated at least 3 inches with blocks or a wedge pillow if you are having any nighttime symptoms Stay upright for 2 hours after eating Avoid meals and snacks three to four hours before bedtime   No aleve, ibuprofen, goody powders, as these are antiinflammatories and can cause inflammation in your stomach, increase bleeding risk and cause ulcers.  You can talk with PCP about alternative pain options.  Can do tyelnol max 3000 mg a day, salon pas patches are over the counter and voltern gel is  topical antiinflammatory that is safe.    Recommend starting on a fiber supplement, can try metamucil first but if this causes gas/bloating switch to benefiber or citracel, these do not cause gas.  Take with fiber with with a full 8 oz glass of water once a day. This can take 1 month to start helping, so try for at least one month.  Recommend increasing water and physical activity.   - Drink at least 64-80 ounces of water/liquid per day. - Establish a time to try to move your bowels every day.  For many people, this is after a cup of coffee or after a meal such as breakfast. - Sit all of the way back on the toilet keeping your back fairly straight and while sitting up, try to rest the tops of your forearms on your upper thighs.   - Raising your feet with a step stool/squatty potty can be helpful to improve the angle that allows your stool to pass through the rectum. - Relax the rectum feeling it bulge toward the toilet water.  If you feel your rectum raising toward your body, you are contracting rather than relaxing. - Breathe in and slowly exhale. "Belly breath" by expanding your belly towards your belly button. Keep belly expanded as you gently direct pressure down and back to the anus.  A low pitched GRRR sound can assist with increasing intra-abdominal pressure.  - Repeat 3-4 times. If unsuccessful, contract the pelvic floor to restore normal tone and get off  the toilet.  Avoid excessive straining. - To reduce excessive wiping by teaching your anus to normally contract, place hands on outer aspect of knees and resist knee movement outward.  Hold 5-10 second then place hands just inside of knees and resist inward movement of knees.  Hold 5 seconds.  Repeat a few times each way.  Go to the ER if unable to pass gas, severe AB pain, unable to hold down food, any shortness of breath of chest pain.  It was a pleasure to see you today!  Thank you for trusting me with your gastrointestinal care!

## 2022-02-18 NOTE — Progress Notes (Signed)
02/18/2022 Dawn Brooks [id number] [birth year]  Referring provider: Nicola Girt, DO Primary GI doctor: Dr. Rush Landmark  ASSESSMENT AND PLAN:   Iron deficiency anemia, unspecified iron deficiency anemia type EGD: 05/08/2021 unremarkable for source of anemia, most likely secondary to menorrhea menorrhagi has beena Continue follow-up with hematology for iron infusions as needed, has been on oral iron. Will recheck CBC, last known hemoglobin 10.9 in February If patient has decreased menorrhagia and continues to have iron deficiency can consider VCE, but most likely secondary to her dysmenorrhea.  Generalized abdominal pain EGD and colonoscopy unremarkable 05/08/2021, CT abdomen pelvis with contrast showed uterine fibroids 05/03/2021. From description sounds more like it is most likely musculoskeletal worse with movement and compression, has been on Mobic with some improvement in discomfort but likely this is worsening her reflux. Can continue for another 2 weeks, add on Pepcid, consider Voltaren gel/lidocaine patches. Will get right upper quadrant abdominal ultrasound to evaluate gallbladder but this does not sound like biliary colic to me. Encouraged follow-up with GYN, possible discomfort from uterine fibroids versus endometriosis. Question IBS as well/gas.  Try Gas-X  Hiatal hernia 1 cm with GERD has been on Mobic, likely contributing to nausea. Given Pepcid 40 mg daily Avoid NSAIDs, avoid alcohol.  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 female  with a past medical history of menorrhagia, GERD, arthritis, anxiety, IDA and others listed below, returns to clinic today for evaluation of abdominal pain worsening last 2 weeks. Marland Kitchen 04/2021 OV IDA and lower AB pain 04/08/2021 showing ferritin of 9/serum iron 69/iron sat 116/TIBC 434 Hemoglobin 11.5/hematocrit 35/MCV of 81.  05/03/2021 CT AB and pelvis with contrast showed uterus  fibroids 05/08/2021 EGD/colonoscopy for IDA  EGD showed white nummular lesions esophagus, 1 cm HH, single gastiric polyp, normal duodenum. Benign biopsies Colon 4 polyps 2-4 mm in size, sigmoid colon. Ticks, internal hemorrhoids Sessile serrated polyps recall 3 years 04/2024  Following with Dr. Baird [facility] in [place] with hematology, had iron infusion April or may, has OV on 27th of this month for follow up.   She continues to have stabbing, pinching AB pain but has gotten worse last 2 weeks.  Right upper quadrant and epigastric, occasional left lower quadrant Went to GYN 11/21 for LLQ pain, has had some dysmenorrhea with periods every 12 to 16 days,, was on ibuprofen prior not helping but given mobic and this may have helped some.  Specific getting pelvic ultrasound with them at follow-up. Has only had 1 episode of dyspareunia and it was more upper abdomen that was uncomfortable. She has pain under right breast, worse if she leans forward for too long or bends the wrong way. Can last 10-20 seconds.   She has had mild nausea, no vomiting, not associated with the pain or with food.  She has GERD with some left sided chest pain, has been on pepcid that helped some, has taken for the last 5 days.  She has iron daily, has dark stool due to this.  Has BM most days, no straining, she has increased fluids, takes vitamin C to help.  She has not been taking fibercon.  She eats oatmeal and increases fiber.  No family history of GB issues.   CBC  05/01/2021  HGB 10.9 MCV 88.1 with normocytic anemia- had had follow up with hematology WBC 4.1 Platelets 293 Kidney function 05/01/2021  BUN 6 Cr 0.70  GFR >60  Potassium 4.0  She  reports that she has never smoked. She has never used smokeless tobacco. She reports that she does not currently use drugs after having used the following drugs: Marijuana. She reports that she does not drink alcohol. Her family history includes Breast cancer in her paternal  aunt; Diabetes in her maternal grandfather and maternal grandmother; Prostate cancer in her father.   Current Medications:      Current Outpatient Medications (Analgesics):    ibuprofen (ADVIL) 200 MG tablet, Take 400 mg by mouth every 6 (six) hours as needed for headache or moderate pain.  Current Outpatient Medications (Hematological):    ferrous sulfate 325 (65 FE) MG tablet, Take 325 mg by mouth 2 (two) times daily with a meal.  Current Outpatient Medications (Other):    Ascorbic Acid (VITAMIN C) 1000 MG tablet, Take 1,000 mg by mouth daily.   Cholecalciferol 25 MCG (1000 UT) tablet, Take 1,000 Units by mouth daily.   Cranberry Fruit 465 MG CAPS, Take 465 mg by mouth daily.   famotidine (PEPCID) 40 MG tablet, Take 1 tablet (40 mg total) by mouth at bedtime.   Fluocinolone Acetonide Scalp 0.01 % OIL, Apply 1 application topically as needed (itching).   lidocaine (LIDODERM) 5 %, Place 1 patch onto the skin daily as needed (pain). Remove & Discard patch within 12 hours or as directed by MD   methocarbamol (ROBAXIN) 500 MG tablet, Take 1 tablet (500 mg total) by mouth every 8 (eight) hours as needed for muscle spasms.   Multiple Vitamin (MULTI-VITAMIN) tablet, Take 1 tablet by mouth daily.   omeprazole (PRILOSEC) 40 MG capsule, TAKE 1 CAPSULE BY MOUTH EVERY DAY BEFORE BREAKFAST   valACYclovir (VALTREX) 500 MG tablet, Take 500 mg by mouth daily as needed (outbreaks).  Surgical History:  She  has a past surgical history that includes Wisdom tooth extraction (1998); Cesarean section (2016); and Wrist surgery (06/14/2016).  Current Medications, Allergies, Past Medical History, Past Surgical History, Family History and Social History were reviewed in [employer] record.  Physical Exam: BP 120/76   Pulse 72   Ht 5' (1.524 m)   Wt 155 lb (70.3 kg)   LMP 02/02/2022   BMI 30.27 kg/m  General:   Pleasant, well developed female in no acute distress Heart : Regular  rate and rhythm; no murmurs Pulm: Clear anteriorly; no wheezing Abdomen:  Soft, Obese AB, Active bowel sounds. mild tenderness in the epigastrium.  With Carnett's sign some pain with palpation with absent gauge Without guarding and Without rebound, No organomegaly appreciated. Rectal: Not evaluated Extremities:  without  edema. Neurologic:  Alert and  oriented x4;  No focal deficits.  Psych:  Cooperative. Normal mood and affect.   Vladimir Crofts, PA-C 02/18/22

## 2022-02-25 ENCOUNTER — Ambulatory Visit (HOSPITAL_COMMUNITY)
Admission: RE | Admit: 2022-02-25 | Discharge: 2022-02-25 | Disposition: A | Discharge status: Home / Self Care | Payer: BC Managed Care – PPO | Source: Ambulatory Visit | Attending: Physician Assistant | Admitting: Physician Assistant

## 2022-02-25 DIAGNOSIS — R1011 Right upper quadrant pain: Secondary | ICD-10-CM | POA: Diagnosis not present

## 2022-02-25 DIAGNOSIS — D509 Iron deficiency anemia, unspecified: Secondary | ICD-10-CM | POA: Insufficient documentation

## 2022-02-25 DIAGNOSIS — R110 Nausea: Secondary | ICD-10-CM | POA: Diagnosis not present

## 2022-02-25 DIAGNOSIS — R1084 Generalized abdominal pain: Secondary | ICD-10-CM | POA: Insufficient documentation

## 2022-02-26 DIAGNOSIS — Z3041 Encounter for surveillance of contraceptive pills: Secondary | ICD-10-CM | POA: Diagnosis not present

## 2022-02-26 DIAGNOSIS — B009 Herpesviral infection, unspecified: Secondary | ICD-10-CM | POA: Diagnosis not present

## 2022-02-26 DIAGNOSIS — N920 Excessive and frequent menstruation with regular cycle: Secondary | ICD-10-CM | POA: Diagnosis not present

## 2022-02-26 DIAGNOSIS — D251 Intramural leiomyoma of uterus: Secondary | ICD-10-CM | POA: Diagnosis not present

## 2022-02-26 DIAGNOSIS — Z87898 Personal history of other specified conditions: Secondary | ICD-10-CM | POA: Diagnosis not present

## 2022-02-26 DIAGNOSIS — R1011 Right upper quadrant pain: Secondary | ICD-10-CM | POA: Diagnosis not present

## 2022-02-26 DIAGNOSIS — D252 Subserosal leiomyoma of uterus: Secondary | ICD-10-CM | POA: Diagnosis not present

## 2022-02-26 DIAGNOSIS — Z3202 Encounter for pregnancy test, result negative: Secondary | ICD-10-CM | POA: Diagnosis not present

## 2022-03-03 ENCOUNTER — Other Ambulatory Visit: Payer: Self-pay

## 2022-03-03 MED ORDER — DICYCLOMINE HCL 20 MG PO TABS: 20.0000 mg | ORAL_TABLET | Freq: Three times a day (TID) | ORAL | 0 refills | Status: DC | PRN

## 2022-03-11 DIAGNOSIS — D649 Anemia, unspecified: Secondary | ICD-10-CM | POA: Diagnosis not present

## 2022-04-03 ENCOUNTER — Ambulatory Visit: Payer: BC Managed Care – PPO | Admitting: Physician Assistant

## 2022-04-13 DIAGNOSIS — R6889 Other general symptoms and signs: Secondary | ICD-10-CM | POA: Diagnosis not present

## 2022-04-13 DIAGNOSIS — R29898 Other symptoms and signs involving the musculoskeletal system: Secondary | ICD-10-CM | POA: Diagnosis not present

## 2022-04-13 DIAGNOSIS — G8929 Other chronic pain: Secondary | ICD-10-CM | POA: Diagnosis not present

## 2022-04-13 DIAGNOSIS — M542 Cervicalgia: Secondary | ICD-10-CM | POA: Diagnosis not present

## 2022-04-29 DIAGNOSIS — G8929 Other chronic pain: Secondary | ICD-10-CM | POA: Diagnosis not present

## 2022-04-29 DIAGNOSIS — R29898 Other symptoms and signs involving the musculoskeletal system: Secondary | ICD-10-CM | POA: Diagnosis not present

## 2022-04-29 DIAGNOSIS — R6889 Other general symptoms and signs: Secondary | ICD-10-CM | POA: Diagnosis not present

## 2022-04-29 DIAGNOSIS — M542 Cervicalgia: Secondary | ICD-10-CM | POA: Diagnosis not present

## 2022-05-09 ENCOUNTER — Other Ambulatory Visit: Payer: Self-pay | Admitting: Physician Assistant

## 2022-05-13 DIAGNOSIS — R6889 Other general symptoms and signs: Secondary | ICD-10-CM | POA: Diagnosis not present

## 2022-05-13 DIAGNOSIS — R29898 Other symptoms and signs involving the musculoskeletal system: Secondary | ICD-10-CM | POA: Diagnosis not present

## 2022-05-13 DIAGNOSIS — M542 Cervicalgia: Secondary | ICD-10-CM | POA: Diagnosis not present

## 2022-05-13 DIAGNOSIS — G8929 Other chronic pain: Secondary | ICD-10-CM | POA: Diagnosis not present

## 2022-05-14 ENCOUNTER — Telehealth: Payer: Self-pay

## 2022-05-14 NOTE — Telephone Encounter (Signed)
Mychart msg sent

## 2022-05-20 DIAGNOSIS — R6889 Other general symptoms and signs: Secondary | ICD-10-CM | POA: Diagnosis not present

## 2022-05-20 DIAGNOSIS — Z7409 Other reduced mobility: Secondary | ICD-10-CM | POA: Diagnosis not present

## 2022-05-20 DIAGNOSIS — Z789 Other specified health status: Secondary | ICD-10-CM | POA: Diagnosis not present

## 2022-05-20 DIAGNOSIS — M542 Cervicalgia: Secondary | ICD-10-CM | POA: Diagnosis not present

## 2022-05-22 DIAGNOSIS — M542 Cervicalgia: Secondary | ICD-10-CM | POA: Diagnosis not present

## 2022-05-22 DIAGNOSIS — F32A Depression, unspecified: Secondary | ICD-10-CM | POA: Diagnosis not present

## 2022-05-22 DIAGNOSIS — G8929 Other chronic pain: Secondary | ICD-10-CM | POA: Diagnosis not present

## 2022-05-22 DIAGNOSIS — F419 Anxiety disorder, unspecified: Secondary | ICD-10-CM | POA: Diagnosis not present

## 2022-05-22 DIAGNOSIS — M7989 Other specified soft tissue disorders: Secondary | ICD-10-CM | POA: Diagnosis not present

## 2022-05-22 DIAGNOSIS — R011 Cardiac murmur, unspecified: Secondary | ICD-10-CM | POA: Diagnosis not present

## 2022-05-22 DIAGNOSIS — M5442 Lumbago with sciatica, left side: Secondary | ICD-10-CM | POA: Diagnosis not present

## 2022-05-22 DIAGNOSIS — Z130 Encounter for screening for diseases of the blood and blood-forming organs and certain disorders involving the immune mechanism: Secondary | ICD-10-CM | POA: Diagnosis not present

## 2022-05-22 DIAGNOSIS — Z1329 Encounter for screening for other suspected endocrine disorder: Secondary | ICD-10-CM | POA: Diagnosis not present

## 2022-05-22 DIAGNOSIS — Z13228 Encounter for screening for other metabolic disorders: Secondary | ICD-10-CM | POA: Diagnosis not present

## 2022-06-10 DIAGNOSIS — D509 Iron deficiency anemia, unspecified: Secondary | ICD-10-CM | POA: Diagnosis not present

## 2022-06-18 DIAGNOSIS — R21 Rash and other nonspecific skin eruption: Secondary | ICD-10-CM | POA: Diagnosis not present

## 2022-06-18 DIAGNOSIS — G4700 Insomnia, unspecified: Secondary | ICD-10-CM | POA: Diagnosis not present

## 2022-06-18 DIAGNOSIS — R5383 Other fatigue: Secondary | ICD-10-CM | POA: Diagnosis not present

## 2022-06-18 DIAGNOSIS — H538 Other visual disturbances: Secondary | ICD-10-CM | POA: Diagnosis not present

## 2022-06-18 DIAGNOSIS — R42 Dizziness and giddiness: Secondary | ICD-10-CM | POA: Diagnosis not present

## 2022-06-18 DIAGNOSIS — R630 Anorexia: Secondary | ICD-10-CM | POA: Diagnosis not present

## 2022-06-18 DIAGNOSIS — R110 Nausea: Secondary | ICD-10-CM | POA: Diagnosis not present

## 2022-06-18 DIAGNOSIS — R634 Abnormal weight loss: Secondary | ICD-10-CM | POA: Diagnosis not present

## 2022-06-28 IMAGING — DX DG CHEST 2V
1 series · 1 of 1 positions shown · non-contrast
Comparison: None.

CLINICAL DATA: Chest pain.

EXAM:
CHEST - 2 VIEW

[chest lat]
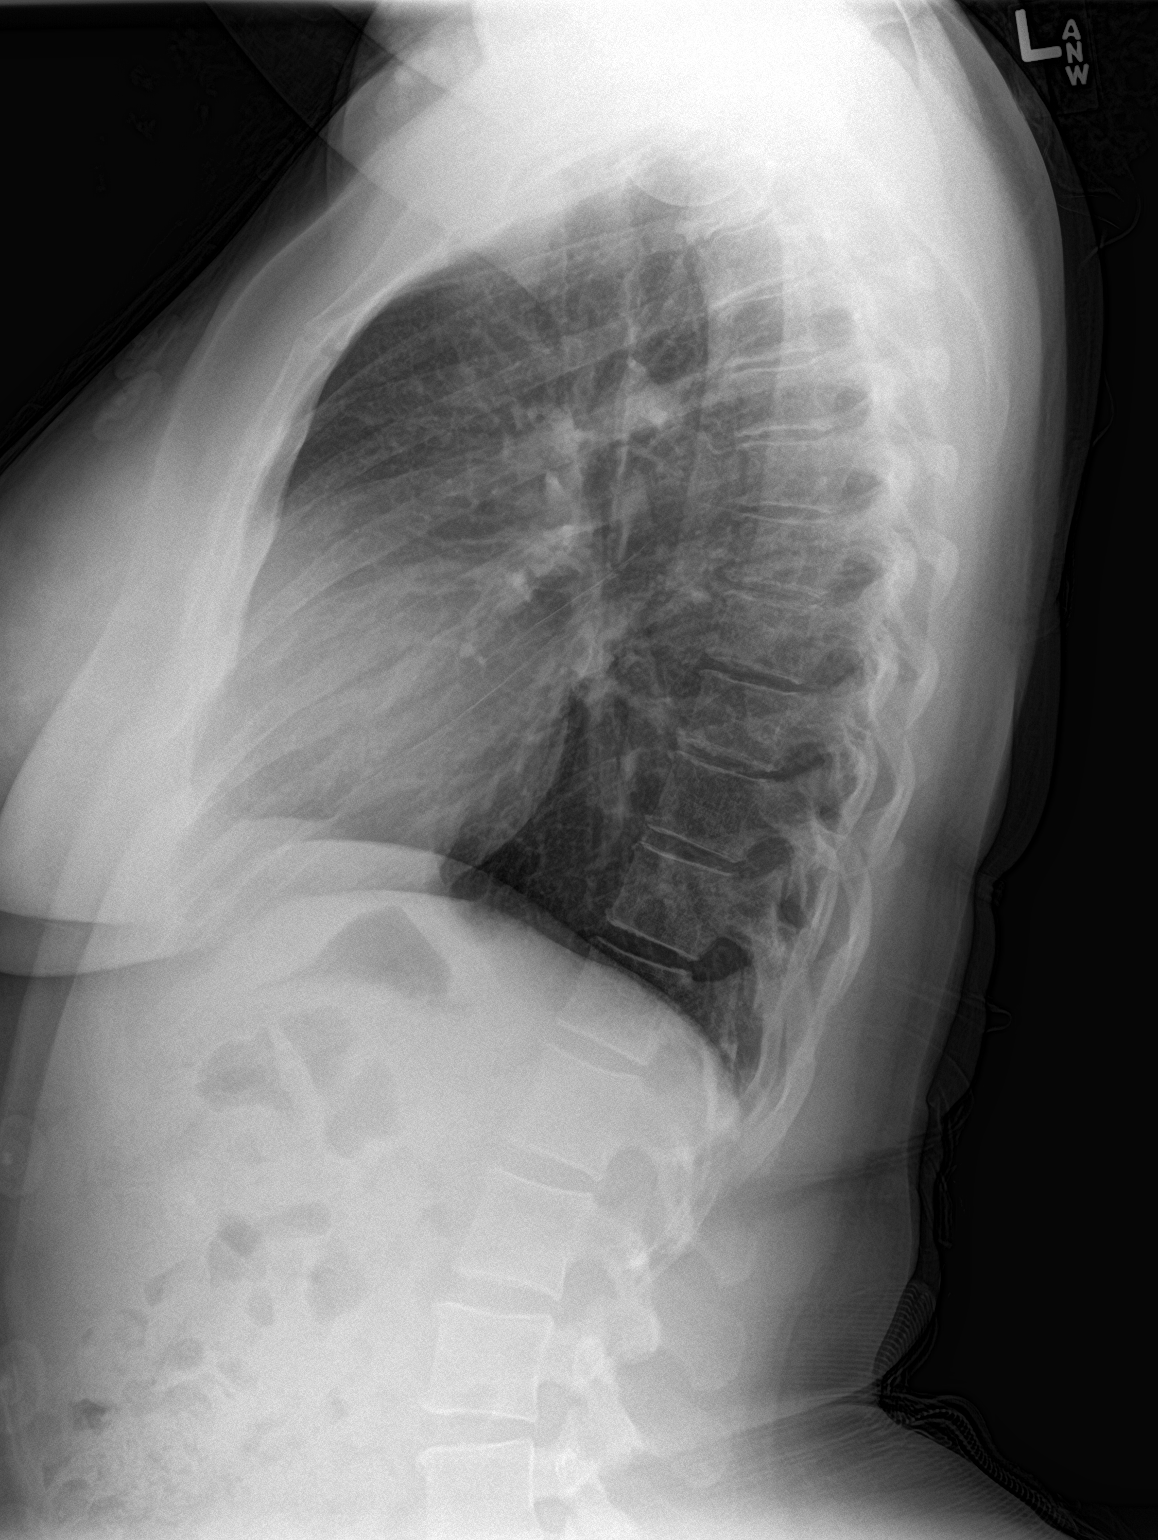

[1 of 1 positions shown; findings below may reference images not displayed]

FINDINGS: The heart size and mediastinal contours are within normal limits.
Mild airspace disease is noted in the infrahilar region on the
right. No consolidation, effusion, or pneumothorax. No acute osseous
abnormality.
IMPRESSION: Mild infrahilar airspace disease on the right, possible atelectasis
versus developing infiltrate.

## 2022-06-29 IMAGING — CT CT ABD-PELV W/ CM
2 of 5 series · 16 of 46 positions shown, 18 images · IV contrast (OMNIPAQUE 300)
Comparison: None.

CLINICAL DATA: Mid abdominal pain for 1 month, dark stools, anemia

EXAM:
CT ABDOMEN AND PELVIS WITH CONTRAST
TECHNIQUE: Multidetector CT imaging of the abdomen and pelvis was performed
using the standard protocol following bolus administration of
intravenous contrast.

[Series 2: abd/pel w · axial · 0.62mm/px · z∈[-434,-74]mm · 13 of 82 slices shown, 15 images]
[im 5/82  soft-tissue]
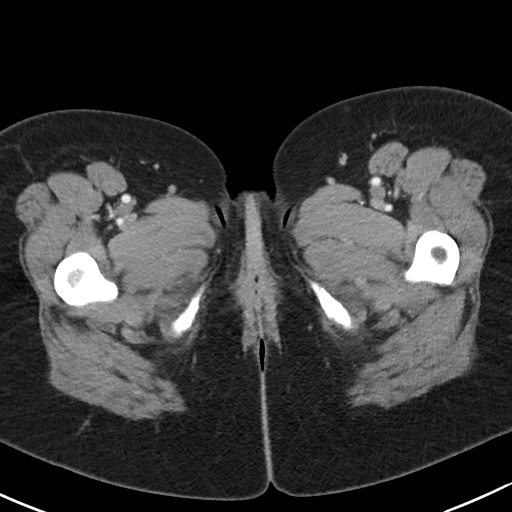
[im 5/82  bone]
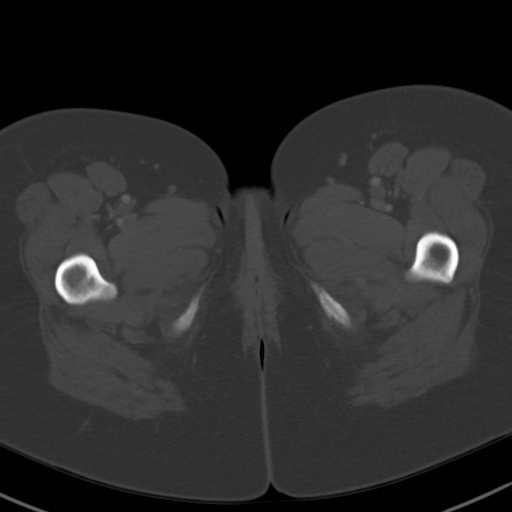
[im 13/82  soft-tissue]
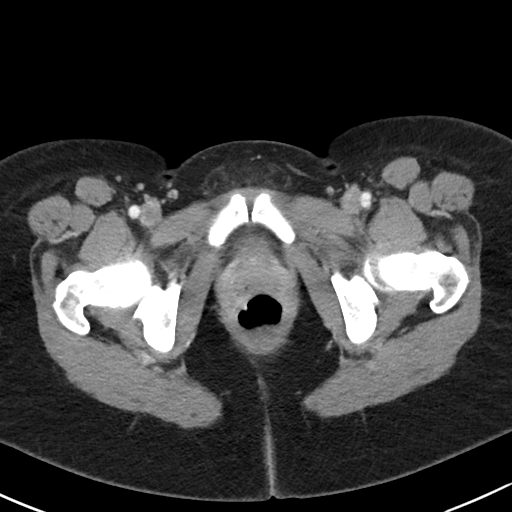
[im 17/82  soft-tissue]
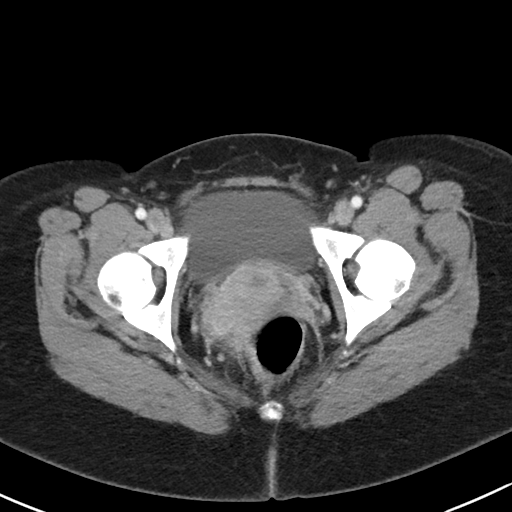
[im 25/82  soft-tissue]
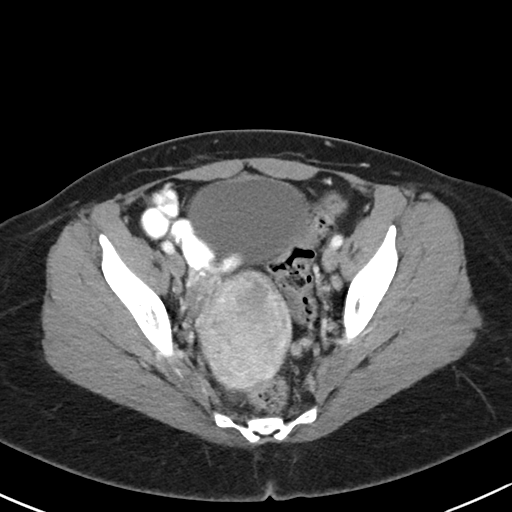
[im 29/82  soft-tissue]
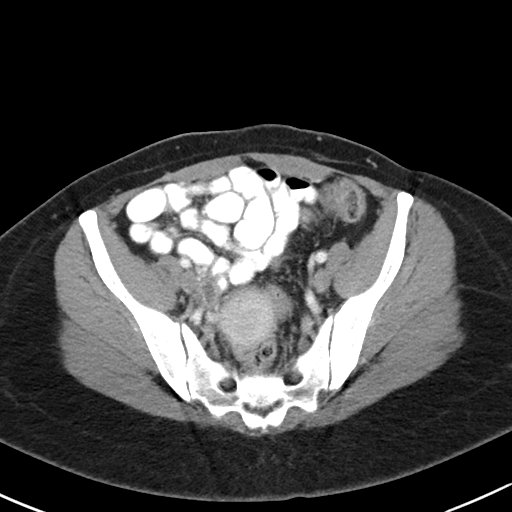
[im 37/82  soft-tissue]
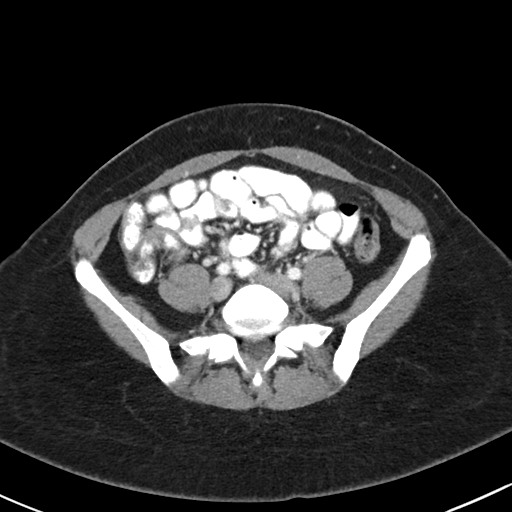
[im 41/82  soft-tissue]
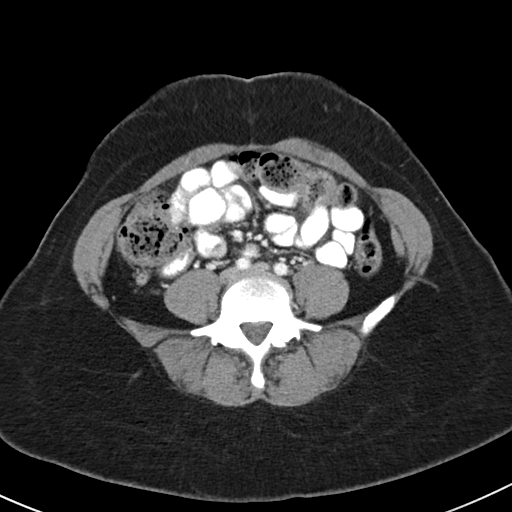
[im 45/82  soft-tissue]
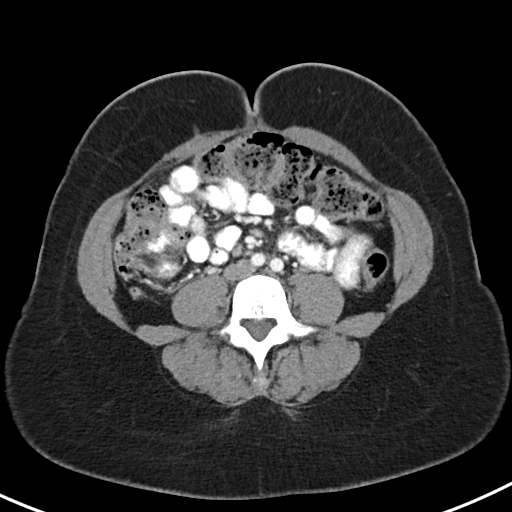
[im 53/82  soft-tissue]
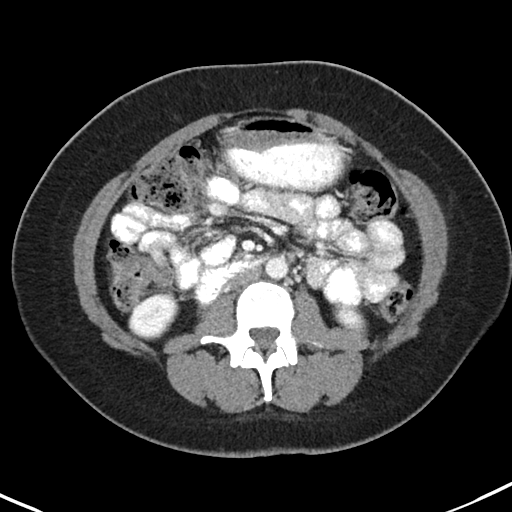
[im 53/82  bone]
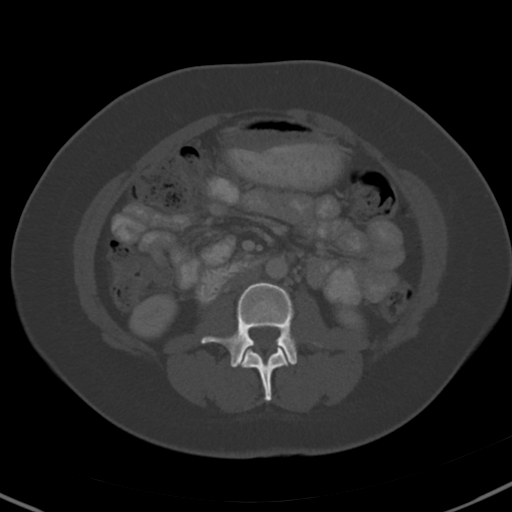
[im 57/82  soft-tissue]
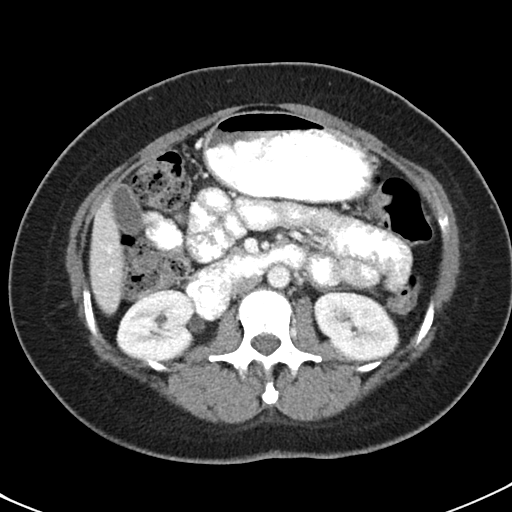
[im 65/82  soft-tissue]
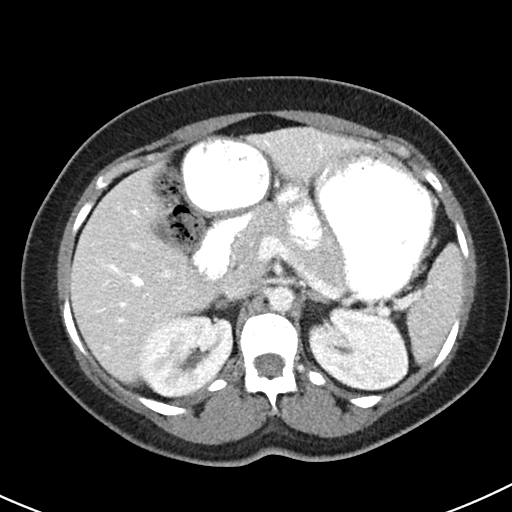
[im 69/82  soft-tissue]
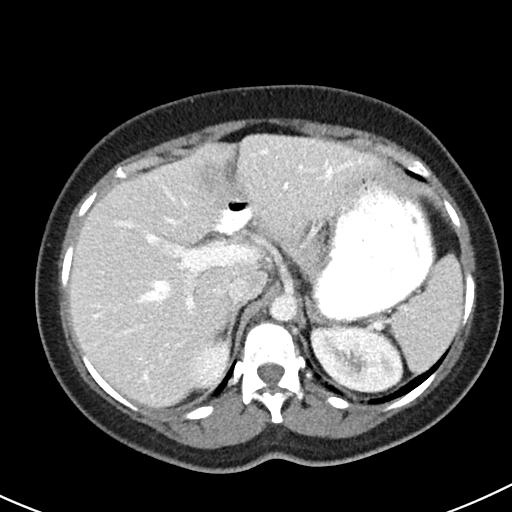
[im 77/82  soft-tissue]
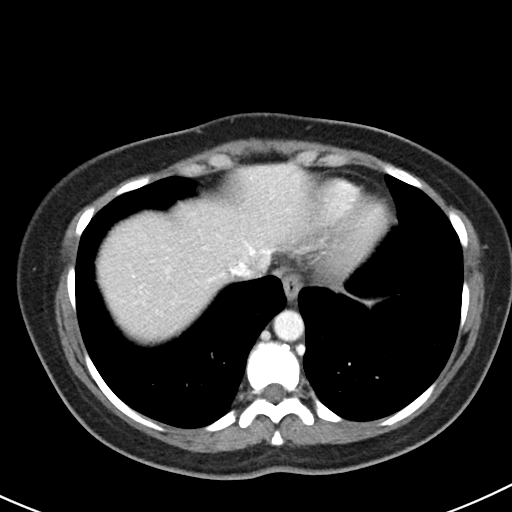

[Series 5: coronal st · coronal · 0.62mm/px · 3 of 71 slices shown]
[im 24/71  soft-tissue]
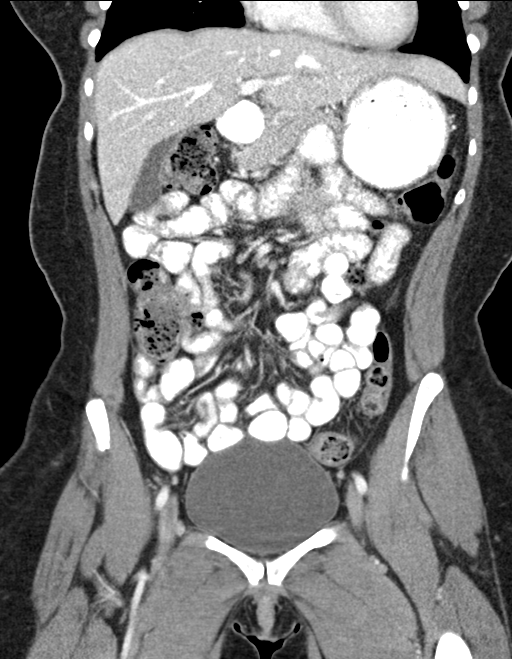
[im 32/71  soft-tissue]
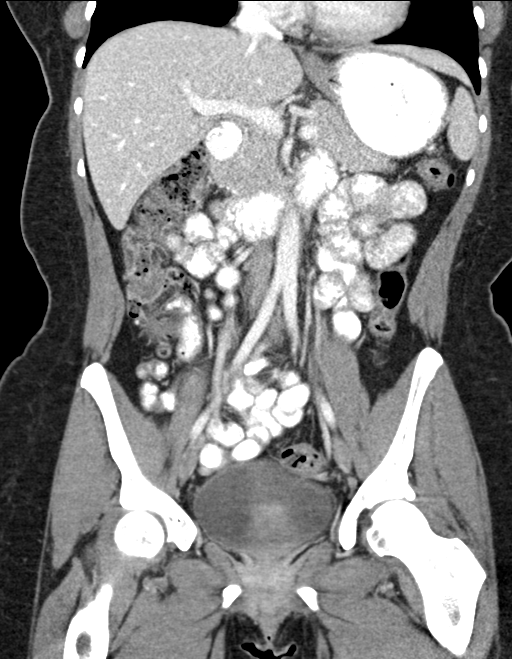
[im 39/71  soft-tissue]
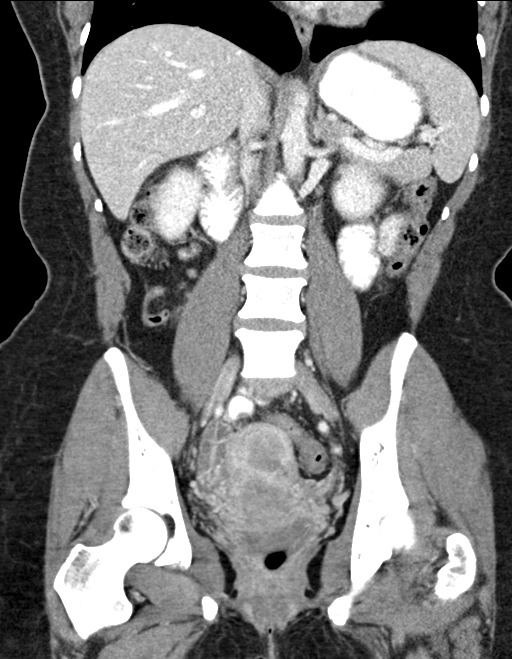

[16 of 46 positions shown; findings below may reference images not displayed]

RADIATION DOSE REDUCTION: This exam was performed according to the
departmental dose-optimization program which includes automated
exposure control, adjustment of the mA and/or kV according to
patient size and/or use of iterative reconstruction technique.

CONTRAST:  100mL OMNIPAQUE IOHEXOL 300 MG/ML  SOLN
FINDINGS: Lower chest: No acute abnormality.

Hepatobiliary: No solid liver abnormality is seen. No gallstones,
gallbladder wall thickening, or biliary dilatation.

Pancreas: Unremarkable. No pancreatic ductal dilatation or
surrounding inflammatory changes.

Spleen: Normal in size without significant abnormality.

Adrenals/Urinary Tract: Adrenal glands are unremarkable. Kidneys are
normal, without renal calculi, solid lesion, or hydronephrosis.
Bladder is unremarkable.

Stomach/Bowel: Stomach is within normal limits. Appendix appears
normal. No evidence of bowel wall thickening, distention, or
inflammatory changes.

Vascular/Lymphatic: No significant vascular findings are present. No
enlarged abdominal or pelvic lymph nodes.

Reproductive: Fibroid of the anterior uterine fundus (series 6,
image 48). Multiple small ovarian follicles.

Other: No abdominal wall hernia or abnormality. No ascites.

Musculoskeletal: No acute or significant osseous findings.
IMPRESSION: 1. No acute CT findings of the abdomen or pelvis to explain mid
abdominal pain or dark stools.
2. Uterine fibroid.

## 2022-07-20 DIAGNOSIS — R630 Anorexia: Secondary | ICD-10-CM | POA: Diagnosis not present

## 2022-07-20 DIAGNOSIS — G44309 Post-traumatic headache, unspecified, not intractable: Secondary | ICD-10-CM | POA: Diagnosis not present

## 2022-07-20 DIAGNOSIS — R431 Parosmia: Secondary | ICD-10-CM | POA: Diagnosis not present

## 2022-07-20 DIAGNOSIS — R790 Abnormal level of blood mineral: Secondary | ICD-10-CM | POA: Diagnosis not present

## 2022-07-20 DIAGNOSIS — M542 Cervicalgia: Secondary | ICD-10-CM | POA: Diagnosis not present

## 2022-07-20 DIAGNOSIS — G4486 Cervicogenic headache: Secondary | ICD-10-CM | POA: Diagnosis not present

## 2022-07-20 DIAGNOSIS — H6121 Impacted cerumen, right ear: Secondary | ICD-10-CM | POA: Diagnosis not present

## 2022-07-20 DIAGNOSIS — E559 Vitamin D deficiency, unspecified: Secondary | ICD-10-CM | POA: Diagnosis not present

## 2022-07-29 DIAGNOSIS — S63592A Other specified sprain of left wrist, initial encounter: Secondary | ICD-10-CM | POA: Diagnosis not present

## 2022-07-29 DIAGNOSIS — M79632 Pain in left forearm: Secondary | ICD-10-CM | POA: Diagnosis not present

## 2022-07-29 DIAGNOSIS — S56912A Strain of unspecified muscles, fascia and tendons at forearm level, left arm, initial encounter: Secondary | ICD-10-CM | POA: Diagnosis not present

## 2022-08-04 ENCOUNTER — Other Ambulatory Visit: Payer: Self-pay | Admitting: Physician Assistant

## 2022-08-04 DIAGNOSIS — G4700 Insomnia, unspecified: Secondary | ICD-10-CM | POA: Diagnosis not present

## 2022-08-04 DIAGNOSIS — R519 Headache, unspecified: Secondary | ICD-10-CM | POA: Diagnosis not present

## 2022-08-04 DIAGNOSIS — R634 Abnormal weight loss: Secondary | ICD-10-CM | POA: Diagnosis not present

## 2022-08-04 DIAGNOSIS — F419 Anxiety disorder, unspecified: Secondary | ICD-10-CM | POA: Diagnosis not present

## 2022-08-04 DIAGNOSIS — R630 Anorexia: Secondary | ICD-10-CM | POA: Diagnosis not present

## 2022-08-04 DIAGNOSIS — R011 Cardiac murmur, unspecified: Secondary | ICD-10-CM | POA: Diagnosis not present

## 2022-08-04 DIAGNOSIS — D509 Iron deficiency anemia, unspecified: Secondary | ICD-10-CM | POA: Diagnosis not present

## 2022-08-04 DIAGNOSIS — F32A Depression, unspecified: Secondary | ICD-10-CM | POA: Diagnosis not present

## 2022-08-04 DIAGNOSIS — R5383 Other fatigue: Secondary | ICD-10-CM | POA: Diagnosis not present

## 2022-08-04 DIAGNOSIS — R431 Parosmia: Secondary | ICD-10-CM | POA: Diagnosis not present

## 2022-08-05 DIAGNOSIS — R519 Headache, unspecified: Secondary | ICD-10-CM | POA: Diagnosis not present

## 2022-08-21 DIAGNOSIS — R768 Other specified abnormal immunological findings in serum: Secondary | ICD-10-CM | POA: Diagnosis not present

## 2022-08-24 ENCOUNTER — Telehealth: Payer: Self-pay | Admitting: Physician Assistant

## 2022-08-24 NOTE — Telephone Encounter (Signed)
Inbound call from patient stating she has loss a significant amount of weight since March. States she has been seen with her PCP and also urology but nothing has been found. She has been scheduled for 8/7. Requesting a call back to discuss what could be done before then. Please advise, thank you.

## 2022-08-25 NOTE — Telephone Encounter (Signed)
Pt requesting a sooner appt, moved her appt to 08/28/22 at 10am with Willette Cluster NP. Pt aware.

## 2022-08-28 ENCOUNTER — Encounter: Payer: Self-pay | Admitting: Nurse Practitioner

## 2022-08-28 ENCOUNTER — Ambulatory Visit (INDEPENDENT_AMBULATORY_CARE_PROVIDER_SITE_OTHER): Payer: BC Managed Care – PPO | Admitting: Nurse Practitioner

## 2022-08-28 VITALS — BP 110/70 | HR 68 | Ht 62.0 in | Wt 138.2 lb

## 2022-08-28 DIAGNOSIS — R634 Abnormal weight loss: Secondary | ICD-10-CM | POA: Diagnosis not present

## 2022-08-28 DIAGNOSIS — R110 Nausea: Secondary | ICD-10-CM | POA: Diagnosis not present

## 2022-08-28 MED ORDER — ONDANSETRON 4 MG PO TBDP: 4.0000 mg | ORAL_TABLET | Freq: Two times a day (BID) | ORAL | 2 refills | Status: AC | PRN

## 2022-08-28 NOTE — Progress Notes (Signed)
Assessment and Plan   Primary GI: Corliss Parish, MD  Brief Narrative:  42 y.o. yo female whose past medical history includes but is not necessarily limited to iron deficiency anemia, GERD with hiatal hernia, uterine fibroids, colon polyps, headaches, positive ANA  Nausea x 6 months and now with reported weight loss of 21 pounds since March. Etiology unclear. PUD needs to be considered in setting of NSAIDs.  Medication related nausea/weight loss? We discussed that weight loss is sometimes multifactorial and can be difficult to sort out.  TSH normal.  -Hold Meloxicam for now -Continue Zofran as needed, will refill.  -Resume Prilosec before breakfast. Can hold Pepcid for now -Schedule for EGD. The risks and benefits of EGD with possible biopsies were discussed with the patient who agrees to proceed.  -If EGD negative then consider CT scan for weight loss though CT Feb 2023 for abdominal pain was unremarkable.   History of sessile serrated polyps.  -3-year interval colonoscopy February 2026   History of Present Illness   Chief complaint: Weight loss  nausea  Patient was last seen by Quentin Mulling, PA December 2023 for evaluation of abdominal pain. Please refer to that note for details.   Interval History  Patient called the office recently with complaints of weight loss and nausea.  She has been undergoing workup by PCP but so far no etiology has been found.  She has a positive ANA .saw Rheumatology and apparently there is no evidence for lupus, RA or any other autoimmune disease  Nausea and vomiting started in November.  She stopped oral iron sometime around January and that may have helped to some degree.  She has also been taking Zofran which helps but she is out of the prescription.  Since March she has unintentionally lost about 21 pounds.  Has early satiety. Also the smell of food makes her lose her appetite.      She started meloxicam in November and takes it nearly  every day.  She has not had any blood in her stool or black stools. She started new meds in March to include hydroxyzine and Lexapro but does not think that is contributing to her symptoms as the nausea was present for several months prior.  Other med changes include the addition of Remeron a couple of months ago  She takes Pepcid at bedtime for reflux .Prilosec on home med list but not really taking it.   BM okay, no blood in stool   TSH normal.     Previous GI Endoscopies / Labs / Imaging   EGD Feb 2023 for heartburn and IDA - White nummular lesions in esophageal mucosa proximally - biopsied. No other gross lesions in esophagus. Z-line regular, 38 cm from the incisors. - 1 cm hiatal hernia. - A single gastric polyp. Resected and retrieved. - Erythematous mucosa in the gastric body. No other gross lesions in the stomach. Biopsied. - No gross lesions in the duodenal bulb, in the first portion of the duodenum and in the second portion of the duodenum. Biopsied.  Colonoscopy February 2023 for iron deficiency anemia - The examined portion of the ileum was normal. - Four 2 to 9 mm polyps in the sigmoid colon, in the transverse colon and in the ascending colon, removed with a cold snare. Resected and retrieved. - Diverticulosis in the cecum. - Normal mucosa in the entire examined colon otherwise. - Non-bleeding non-thrombosed internal hemorrhoids  CTAP with contrast February 2023 for abdominal pain, anemia -No acute  findings in the abdomen or pelvis to explain the pain  RUQ [place] December 2023 for abdominal pain -Normal      Diagnosis 1. Surgical [P], duodenal BENIGN DUODENAL MUCOSA WITH NO DIAGNOSTIC ABNORMALITY 2. 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 PYLORI, INTESTINAL METAPLASIA, DYSPLASIA AND CARCINOMA 3. Surgical [P], gastric polyp COMPATIBLE WITH BENIGN FUNDIC GLAND POLYP MILD CHRONIC GASTRITIS NEGATIVE FOR H.  PYLORI, INTESTINAL METAPLASIA, DYSPLASIA AND CARCINOMA 4. Surgical [P], colon, proximal esophagus BENIGN SQUAMOUS MUCOSA 5. Surgical [P], colon, transverse, ascending and sigmoid, polyp (4) SESSILE SERRATED POLYPS WITHOUT CYTOLOGIC DYSPLASIA      Latest Ref Rng & Units 02/18/2022    9:45 AM  Hepatic Function  Total Protein 6.0 - 8.3 g/dL 6.9   Albumin 3.5 - 5.2 g/dL 4.1   AST 0 - 37 U/L 11   ALT 0 - 35 U/L 12   Alk Phosphatase 39 - 117 U/L 46   Total Bilirubin 0.2 - 1.2 mg/dL 0.2        Latest Ref Rng & Units 02/18/2022    9:45 AM 05/01/2021   10:09 PM 04/22/2021   10:24 AM  CBC  WBC 4.0 - 10.5 K/uL 5.0  4.1  3.7   Hemoglobin 12.0 - 15.0 g/dL 16.1  09.6  04.5   Hematocrit 36.0 - 46.0 % 35.9  34.9  36.0   Platelets 150.0 - 400.0 K/uL 365.0  293  309.0      Past Medical History:  Diagnosis Date   Anemia    Anxiety    Arthritis    Depression    GERD (gastroesophageal reflux disease)    Heart murmur     Past Surgical History:  Procedure Laterality Date   CESAREAN SECTION  2016   WISDOM TOOTH EXTRACTION  1998   WRIST SURGERY  06/14/2016    Current Medications, Allergies, Family History and Social History were reviewed in [employer] record.     Current Outpatient Medications  Medication Sig Dispense Refill   dicyclomine (BENTYL) 20 MG tablet Take 1 tablet (20 mg total) by mouth 3 (three) times daily as needed for spasms (abdominal pain). 50 tablet 0   Ascorbic Acid (VITAMIN C) 1000 MG tablet Take 1,000 mg by mouth daily.     Cholecalciferol 25 MCG (1000 UT) tablet Take 1,000 Units by mouth daily.     Cranberry Fruit 465 MG CAPS Take 465 mg by mouth daily.     famotidine (PEPCID) 40 MG tablet TAKE 1 TABLET BY MOUTH EVERYDAY AT BEDTIME 90 tablet 1   ferrous sulfate 325 (65 FE) MG tablet Take 325 mg by mouth 2 (two) times daily with a meal.     Fluocinolone Acetonide Scalp 0.01 % OIL Apply 1 application topically as needed (itching).      ibuprofen (ADVIL) 200 MG tablet Take 400 mg by mouth every 6 (six) hours as needed for headache or moderate pain.     lidocaine (LIDODERM) 5 % Place 1 patch onto the skin daily as needed (pain). Remove & Discard patch within 12 hours or as directed by MD     methocarbamol (ROBAXIN) 500 MG tablet Take 1 tablet (500 mg total) by mouth every 8 (eight) hours as needed for muscle spasms. 8 tablet 0   Multiple Vitamin (MULTI-VITAMIN) tablet Take 1 tablet by mouth daily.     omeprazole (PRILOSEC) 40 MG capsule TAKE 1 CAPSULE BY MOUTH EVERY DAY BEFORE BREAKFAST 90  capsule 1   valACYclovir (VALTREX) 500 MG tablet Take 500 mg by mouth daily as needed (outbreaks).     No current facility-administered medications for this visit.    Review of Systems: No chest pain. No shortness of breath. No urinary complaints.    Physical Exam  Wt Readings from Last 3 Encounters:  02/18/22 155 lb (70.3 kg)  05/08/21 154 lb (69.9 kg)  04/22/21 154 lb 3.2 oz (69.9 kg)    BP 110/70 (BP Location: Left Arm, Patient Position: Sitting, Cuff Size: Normal)   Pulse 68   Ht 5\' 2"  (1.575 m)   Wt 138 lb 4 oz (62.7 kg)   LMP 08/06/2022   BMI 25.29 kg/m  Constitutional:  Pleasant, generally well appearing female in no acute distress. Psychiatric: Normal mood and affect. Behavior is normal. EENT: Pupils normal.  Conjunctivae are normal. No scleral icterus. Neck supple.  Cardiovascular: Normal rate, regular rhythm.  Pulmonary/chest: Effort normal and breath sounds normal. No wheezing, rales or rhonchi. Abdominal: Soft, nondistended, nontender. Bowel sounds active throughout. There are no masses palpable. No hepatomegaly. Neurological: Alert and oriented to person place and time.  Skin: Skin is warm and dry. No rashes noted.  Willette Cluster, NP  08/28/2022, 8:28 AM

## 2022-08-28 NOTE — Progress Notes (Signed)
Attending Physician's Attestation   I have reviewed the chart.   I agree with the Advanced Practitioner's note, impression, and recommendations with any updates as below. Thank you Dr. Chales Abrahams for being available for earlier endoscopy slot to evaluate this patient's symptoms.   Corliss Parish, MD [facility] Advanced Endoscopy Office # [phone]

## 2022-08-28 NOTE — Patient Instructions (Addendum)
-  Hold Meloxicam for now -Resume Prilosec before breakfast Can hold Pepcid for now -Continue Zofran as needed for nausea  You have been scheduled for an endoscopy. Please follow written instructions given to you at your visit today. If you use inhalers (even only as needed), please bring them with you on the day of your procedure.   We have sent the following medications to your pharmacy for you to pick up at your convenience: Zofran _______________________________________________________  If your blood pressure at your visit was 140/90 or greater, please contact your primary care physician to follow up on this.  _______________________________________________________  If you are age 10 or younger, your body mass index should be between 19-25. Your Body mass index is 25.29 kg/m. If this is out of the aformentioned range listed, please consider follow up with your Primary Care Provider.   __________________________________________________________  The [facility] providers would like to encourage you to use [facility] to communicate with providers for non-urgent requests or questions.  Due to long hold times on the telephone, sending your provider a message by [facility] may be a faster and more efficient way to get a response.  Please allow 48 business hours for a response.  Please remember that this is for non-urgent requests.   Due to recent changes in healthcare laws, you may see the results of your imaging and laboratory studies on MyChart before your provider has had a chance to review them.  We understand that in some cases there may be results that are confusing or concerning to you. Not all laboratory results come back in the same time frame and the provider may be waiting for multiple results in order to interpret others.  Please give [place] 48 hours in order for your provider to thoroughly review all the results before contacting the office for clarification of your results.     Thank you for  choosing me and [facility].  Willette Cluster, NP.

## 2022-09-04 DIAGNOSIS — M25532 Pain in left wrist: Secondary | ICD-10-CM | POA: Diagnosis not present

## 2022-09-04 DIAGNOSIS — S6392XD Sprain of unspecified part of left wrist and hand, subsequent encounter: Secondary | ICD-10-CM | POA: Diagnosis not present

## 2022-09-04 DIAGNOSIS — M79642 Pain in left hand: Secondary | ICD-10-CM | POA: Diagnosis not present

## 2022-09-08 ENCOUNTER — Encounter: Payer: Self-pay | Admitting: Gastroenterology

## 2022-09-08 ENCOUNTER — Encounter: Payer: BC Managed Care – PPO | Admitting: Gastroenterology

## 2022-09-08 ENCOUNTER — Ambulatory Visit (AMBULATORY_SURGERY_CENTER): Payer: BC Managed Care – PPO | Admitting: Gastroenterology

## 2022-09-08 VITALS — BP 100/57 | HR 63 | Temp 98.2°F | Resp 18 | Ht 62.0 in | Wt 138.0 lb

## 2022-09-08 DIAGNOSIS — R634 Abnormal weight loss: Secondary | ICD-10-CM

## 2022-09-08 DIAGNOSIS — K319 Disease of stomach and duodenum, unspecified: Secondary | ICD-10-CM | POA: Diagnosis not present

## 2022-09-08 DIAGNOSIS — K2970 Gastritis, unspecified, without bleeding: Secondary | ICD-10-CM | POA: Diagnosis not present

## 2022-09-08 DIAGNOSIS — R110 Nausea: Secondary | ICD-10-CM

## 2022-09-08 DIAGNOSIS — F32A Depression, unspecified: Secondary | ICD-10-CM | POA: Diagnosis not present

## 2022-09-08 DIAGNOSIS — F419 Anxiety disorder, unspecified: Secondary | ICD-10-CM | POA: Diagnosis not present

## 2022-09-08 MED ORDER — SODIUM CHLORIDE 0.9 % IV SOLN: 500.0000 mL | Freq: Once | INTRAVENOUS | Status: DC

## 2022-09-08 NOTE — Patient Instructions (Addendum)
    Handout on gastritis given to you today ' Await results of stomach biopsies   See Gunnar Fusi PA in Dr Urban Gibson office for follow up in 6- 8 weeks - office will call you    YOU HAD AN ENDOSCOPIC PROCEDURE TODAY AT THE [facility]:   Refer to the procedure report that was given to you for any specific questions about what was found during the examination.  If the procedure report does not answer your questions, please call your gastroenterologist to clarify.  If you requested that your care partner not be given the details of your procedure findings, then the procedure report has been included in a sealed envelope for you to review at your convenience later.  YOU SHOULD EXPECT: Some feelings of bloating in the abdomen. Passage of more gas than usual.  Walking can help get rid of the air that was put into your GI tract during the procedure and reduce the bloating. If you had a lower endoscopy (such as a colonoscopy or flexible sigmoidoscopy) you may notice spotting of blood in your stool or on the toilet paper. If you underwent a bowel prep for your procedure, you may not have a normal bowel movement for a few days.  Please Note:  You might notice some irritation and congestion in your nose or some drainage.  This is from the oxygen used during your procedure.  There is no need for concern and it should clear up in a day or so.  SYMPTOMS TO REPORT IMMEDIATELY:   Following upper endoscopy (EGD)  Vomiting of blood or coffee ground material  New chest pain or pain under the shoulder blades  Painful or persistently difficult swallowing  New shortness of breath  Fever of 100F or higher  Black, tarry-looking stools  For urgent or emergent issues, a gastroenterologist can be reached at any hour by calling [phone]. Do not use MyChart messaging for urgent concerns.    DIET:  We do recommend a small meal at first, but then you may proceed to your regular diet.  Drink plenty of  fluids but you should avoid alcoholic beverages for 24 hours.  ACTIVITY:  You should plan to take it easy for the rest of today and you should NOT DRIVE or use heavy machinery until tomorrow (because of the sedation medicines used during the test).    FOLLOW UP: Our staff will call the number listed on your records the next business day following your procedure.  We will call around 7:15- 8:00 am to check on you and address any questions or concerns that you may have regarding the information given to you following your procedure. If we do not reach you, we will leave a message.     If any biopsies were taken you will be contacted by phone or by letter within the next 1-3 weeks.  Please call us at [phone] if you have not heard about the biopsies in 3 weeks.    SIGNATURES/CONFIDENTIALITY: You and/or your care partner have signed paperwork which will be entered into your electronic medical record.  These signatures attest to the fact that that the information above on your After Visit Summary has been reviewed and is understood.  Full responsibility of the confidentiality of this discharge information lies with you and/or your care-partner.

## 2022-09-08 NOTE — Progress Notes (Signed)
Called to room to assist during endoscopic procedure.  Patient ID and intended procedure confirmed with present staff. Received instructions for my participation in the procedure from the performing physician.  

## 2022-09-08 NOTE — Progress Notes (Signed)
     EGD note (scope GIF H190.  Serial [id number])  Provation not working  Esophagus: Normal with well-defined Z-line at 38 cm, examined by NBI.  Stomach: Mild antral gastritis.  No ulcers or erosions.  Multiple biopsies were obtained from the antrum, body of the stomach and fundus.  Duodenum: Easily intubated.  Normal.  Not biopsied since previous biopsies were negative for celiac disease.   Plan: -Follow biopsies -Omeprazole 20 mg p.o. every morning -Check weight every week -Follow-up with Gunnar Fusi in 6 to 8 weeks.  Earlier, if with weight loss for further workup by means of CT  RG   Cc Tiburcio Pea

## 2022-09-08 NOTE — Progress Notes (Signed)
Assessment and Plan    Primary GI: Corliss Parish, MD   Brief Narrative:  42 y.o. yo female whose past medical history includes but is not necessarily limited to iron deficiency anemia, GERD with hiatal hernia, uterine fibroids, colon polyps, headaches, positive ANA   Nausea x 6 months and now with reported weight loss of 21 pounds since March. Etiology unclear. PUD needs to be considered in setting of NSAIDs.  Medication related nausea/weight loss? We discussed that weight loss is sometimes multifactorial and can be difficult to sort out.  TSH normal.  -Hold Meloxicam for now -Continue Zofran as needed, will refill.  -Resume Prilosec before breakfast. Can hold Pepcid for now -Schedule for EGD. The risks and benefits of EGD with possible biopsies were discussed with the patient who agrees to proceed.  -If EGD negative then consider CT scan for weight loss though CT Feb 2023 for abdominal pain was unremarkable.    History of sessile serrated polyps.  -3-year interval colonoscopy February 2026     History of Present Illness    Chief complaint: Weight loss  nausea   Patient was last seen by Quentin Mulling, PA December 2023 for evaluation of abdominal pain. Please refer to that note for details.    Interval History  Patient called the office recently with complaints of weight loss and nausea.  She has been undergoing workup by PCP but so far no etiology has been found.  She has a positive ANA .saw Rheumatology and apparently there is no evidence for lupus, RA or any other autoimmune disease   Nausea and vomiting started in November.  She stopped oral iron sometime around January and that may have helped to some degree.  She has also been taking Zofran which helps but she is out of the prescription.  Since March she has unintentionally lost about 21 pounds.  Has early satiety. Also the smell of food makes her lose her appetite.        She started meloxicam in November and takes  it nearly every day.  She has not had any blood in her stool or black stools. She started new meds in March to include hydroxyzine and Lexapro but does not think that is contributing to her symptoms as the nausea was present for several months prior.  Other med changes include the addition of Remeron a couple of months ago   She takes Pepcid at bedtime for reflux .Prilosec on home med list but not really taking it.    BM okay, no blood in stool    TSH normal.      Previous GI Endoscopies / Labs / Imaging    EGD Feb 2023 for heartburn and IDA - White nummular lesions in esophageal mucosa proximally - biopsied. No other gross lesions in esophagus. Z-line regular, 38 cm from the incisors. - 1 cm hiatal hernia. - A single gastric polyp. Resected and retrieved. - Erythematous mucosa in the gastric body. No other gross lesions in the stomach. Biopsied. - No gross lesions in the duodenal bulb, in the first portion of the duodenum and in the second portion of the duodenum. Biopsied.   Colonoscopy February 2023 for iron deficiency anemia - The examined portion of the ileum was normal. - Four 2 to 9 mm polyps in the sigmoid colon, in the transverse colon and in the ascending colon, removed with a cold snare. Resected and retrieved. - Diverticulosis in the cecum. - Normal mucosa in the entire  examined colon otherwise. - Non-bleeding non-thrombosed internal hemorrhoids   CTAP with contrast February 2023 for abdominal pain, anemia -No acute findings in the abdomen or pelvis to explain the pain   RUQ [place] December 2023 for abdominal pain -Normal       Diagnosis 1. Surgical [P], duodenal BENIGN DUODENAL MUCOSA WITH NO DIAGNOSTIC ABNORMALITY 2. 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 PYLORI, INTESTINAL METAPLASIA, DYSPLASIA AND CARCINOMA 3. Surgical [P], gastric polyp COMPATIBLE WITH BENIGN FUNDIC GLAND POLYP MILD CHRONIC  GASTRITIS NEGATIVE FOR H. PYLORI, INTESTINAL METAPLASIA, DYSPLASIA AND CARCINOMA 4. Surgical [P], colon, proximal esophagus BENIGN SQUAMOUS MUCOSA 5. Surgical [P], colon, transverse, ascending and sigmoid, polyp (4) SESSILE SERRATED POLYPS WITHOUT CYTOLOGIC DYSPLASIA         Latest Ref Rng & Units 02/18/2022    9:45 AM  Hepatic Function  Total Protein 6.0 - 8.3 g/dL 6.9   Albumin 3.5 - 5.2 g/dL 4.1   AST 0 - 37 U/L 11   ALT 0 - 35 U/L 12   Alk Phosphatase 39 - 117 U/L 46   Total Bilirubin 0.2 - 1.2 mg/dL 0.2           Latest Ref Rng & Units 02/18/2022    9:45 AM 05/01/2021   10:09 PM 04/22/2021   10:24 AM  CBC  WBC 4.0 - 10.5 K/uL 5.0  4.1  3.7   Hemoglobin 12.0 - 15.0 g/dL 40.9  81.1  91.4   Hematocrit 36.0 - 46.0 % 35.9  34.9  36.0   Platelets 150.0 - 400.0 K/uL 365.0  293  309.0             Past Medical History:  Diagnosis Date   Anemia     Anxiety     Arthritis     Depression     GERD (gastroesophageal reflux disease)     Heart murmur             Past Surgical History:  Procedure Laterality Date   CESAREAN SECTION   2016   WISDOM TOOTH EXTRACTION   1998   WRIST SURGERY   06/14/2016      Current Medications, Allergies, Family History and Social History were reviewed in [employer] record.           Current Outpatient Medications  Medication Sig Dispense Refill   dicyclomine (BENTYL) 20 MG tablet Take 1 tablet (20 mg total) by mouth 3 (three) times daily as needed for spasms (abdominal pain). 50 tablet 0   Ascorbic Acid (VITAMIN C) 1000 MG tablet Take 1,000 mg by mouth daily.       Cholecalciferol 25 MCG (1000 UT) tablet Take 1,000 Units by mouth daily.       Cranberry Fruit 465 MG CAPS Take 465 mg by mouth daily.       famotidine (PEPCID) 40 MG tablet TAKE 1 TABLET BY MOUTH EVERYDAY AT BEDTIME 90 tablet 1   ferrous sulfate 325 (65 FE) MG tablet Take 325 mg by mouth 2 (two) times daily with a meal.       Fluocinolone Acetonide Scalp  0.01 % OIL Apply 1 application topically as needed (itching).       ibuprofen (ADVIL) 200 MG tablet Take 400 mg by mouth every 6 (six) hours as needed for headache or moderate pain.       lidocaine (LIDODERM) 5 % Place 1 patch onto the skin daily as needed (pain).  Remove & Discard patch within 12 hours or as directed by MD       methocarbamol (ROBAXIN) 500 MG tablet Take 1 tablet (500 mg total) by mouth every 8 (eight) hours as needed for muscle spasms. 8 tablet 0   Multiple Vitamin (MULTI-VITAMIN) tablet Take 1 tablet by mouth daily.       omeprazole (PRILOSEC) 40 MG capsule TAKE 1 CAPSULE BY MOUTH EVERY DAY BEFORE BREAKFAST 90 capsule 1   valACYclovir (VALTREX) 500 MG tablet Take 500 mg by mouth daily as needed (outbreaks).        No current facility-administered medications for this visit.      Review of Systems: No chest pain. No shortness of breath. No urinary complaints.      Physical Exam      Wt Readings from Last 3 Encounters:  02/18/22 155 lb (70.3 kg)  05/08/21 154 lb (69.9 kg)  04/22/21 154 lb 3.2 oz (69.9 kg)      BP 110/70 (BP Location: Left Arm, Patient Position: Sitting, Cuff Size: Normal)   Pulse 68   Ht 5\' 2"  (1.575 m)   Wt 138 lb 4 oz (62.7 kg)   LMP 08/06/2022   BMI 25.29 kg/m  Constitutional:  Pleasant, generally well appearing female in no acute distress. Psychiatric: Normal mood and affect. Behavior is normal. EENT: Pupils normal.  Conjunctivae are normal. No scleral icterus. Neck supple.  Cardiovascular: Normal rate, regular rhythm.  Pulmonary/chest: Effort normal and breath sounds normal. No wheezing, rales or rhonchi. Abdominal: Soft, nondistended, nontender. Bowel sounds active throughout. There are no masses palpable. No hepatomegaly. Neurological: Alert and oriented to person place and time.  Skin: Skin is warm and dry. No rashes noted.   Willette Cluster, NP    Attending physician's note   I have taken history, reviewed the chart and examined  the patient. I performed a substantive portion of this encounter, including complete performance of at least one of the key components, in conjunction with the APP. I agree with the Advanced Practitioner's note, impression and recommendations.   EGD today  Prev EGD 04/2021 with small HH, small fundic gland polyp.  Negative gastric and small bowel biopsies   Edman Circle, MD [facility] [phone]

## 2022-09-08 NOTE — Progress Notes (Signed)
Report to PACU, RN, vss, BBS= Clear.  

## 2022-09-08 NOTE — Progress Notes (Signed)
Pt's states no medical or surgical changes since previsit or office visit. 

## 2022-09-08 NOTE — Op Note (Signed)
[facility] Patient Name: Dawn Brooks Procedure Date: 09/08/2022 4:30 PM MRN: [id number] Endoscopist: Lynann Bologna , MD, [id number] Age: 42 Referring MD:  Date of Birth: [birth year] Gender: Female Account #: [id number] Procedure:                Upper GI endoscopy Indications:              Epigastric abdominal pain, nausea with history of                            weight loss Medicines:                Monitored Anesthesia Care Procedure:                Pre-Anesthesia Assessment:                           - Prior to the procedure, a History and Physical                            was performed, and patient medications and                            allergies were reviewed. The patient's tolerance of                            previous anesthesia was also reviewed. The risks                            and benefits of the procedure and the sedation                            options and risks were discussed with the patient.                            All questions were answered, and informed consent                            was obtained. Prior Anticoagulants: The patient has                            taken no anticoagulant or antiplatelet agents. ASA                            Grade Assessment: II - A patient with mild systemic                            disease. After reviewing the risks and benefits,                            the patient was deemed in satisfactory condition to                            undergo the procedure.  After obtaining informed consent, the endoscope was                            passed under direct vision. Throughout the                            procedure, the patient's blood pressure, pulse, and                            oxygen saturations were monitored continuously. The                            GIF [id number] #[id number] was introduced through the                            mouth, and advanced to the second part of  duodenum.                            The upper GI endoscopy was accomplished without                            difficulty. The patient tolerated the procedure                            well.                           Provation was not working at the time of EGD.                            Hence, no pictures. Findings:                 The examined esophagus was normal.                           The Z-line was regular and was found 38 cm from the                            incisors.                           Localized minimal inflammation characterized by                            erythema was found in the gastric antrum. Biopsies                            were taken with a cold forceps for histology.                           The examined duodenum was normal. Not biopsied                            since recent biopsies were negative for celiac  disease. Complications:            No immediate complications. Estimated Blood Loss:     Estimated blood loss: none. Impression:               - Mild gastritis.                           - Otherwise normal EGD. Recommendation:           - Patient has a contact number available for                            emergencies. The signs and symptoms of potential                            delayed complications were discussed with the                            patient. Return to normal activities tomorrow.                            Written discharge instructions were provided to the                            patient.                           - Resume previous diet.                           - Continue present medications.                           - Await pathology results.                           - Check weight weekly. If there is any further                            weight loss, further workup likely by means of CT                           - Follow-up in GI clinic in 6 to 8 weeks. Earlier                             if still with problems.                           - The findings and recommendations were discussed                            with the patient's family. Lynann Bologna, MD 09/08/2022 4:35:18 PM This report has been signed electronically.

## 2022-09-09 ENCOUNTER — Telehealth: Payer: Self-pay

## 2022-09-09 NOTE — Telephone Encounter (Signed)
  Follow up Call-     09/08/2022    1:10 PM 05/08/2021    2:15 PM  Call back number  Post procedure Call Back phone  # [phone]  Permission to leave phone message Yes Yes     Patient questions:  Do you have a fever, pain , or abdominal swelling? No. Pain Score  0 *  Have you tolerated food without any problems? Yes.    Have you been able to return to your normal activities? Yes.    Do you have any questions about your discharge instructions: Diet   No. Medications  No. Follow up visit  No.  Do you have questions or concerns about your Care? No.  Actions: * If pain score is 4 or above: No action needed, pain <4.

## 2022-09-10 DIAGNOSIS — M79642 Pain in left hand: Secondary | ICD-10-CM | POA: Diagnosis not present

## 2022-09-10 DIAGNOSIS — M25532 Pain in left wrist: Secondary | ICD-10-CM | POA: Diagnosis not present

## 2022-09-10 DIAGNOSIS — S6392XD Sprain of unspecified part of left wrist and hand, subsequent encounter: Secondary | ICD-10-CM | POA: Diagnosis not present

## 2022-09-14 DIAGNOSIS — M25532 Pain in left wrist: Secondary | ICD-10-CM | POA: Diagnosis not present

## 2022-09-14 DIAGNOSIS — S6392XD Sprain of unspecified part of left wrist and hand, subsequent encounter: Secondary | ICD-10-CM | POA: Diagnosis not present

## 2022-09-14 DIAGNOSIS — M79642 Pain in left hand: Secondary | ICD-10-CM | POA: Diagnosis not present

## 2022-09-16 DIAGNOSIS — M79642 Pain in left hand: Secondary | ICD-10-CM | POA: Diagnosis not present

## 2022-09-16 DIAGNOSIS — M25532 Pain in left wrist: Secondary | ICD-10-CM | POA: Diagnosis not present

## 2022-09-16 DIAGNOSIS — S6392XD Sprain of unspecified part of left wrist and hand, subsequent encounter: Secondary | ICD-10-CM | POA: Diagnosis not present

## 2022-09-21 DIAGNOSIS — M25532 Pain in left wrist: Secondary | ICD-10-CM | POA: Diagnosis not present

## 2022-09-21 DIAGNOSIS — M79642 Pain in left hand: Secondary | ICD-10-CM | POA: Diagnosis not present

## 2022-09-21 DIAGNOSIS — S6392XD Sprain of unspecified part of left wrist and hand, subsequent encounter: Secondary | ICD-10-CM | POA: Diagnosis not present

## 2022-09-22 ENCOUNTER — Other Ambulatory Visit: Payer: Self-pay

## 2022-09-22 ENCOUNTER — Telehealth: Payer: Self-pay

## 2022-09-22 DIAGNOSIS — R110 Nausea: Secondary | ICD-10-CM

## 2022-09-22 DIAGNOSIS — R634 Abnormal weight loss: Secondary | ICD-10-CM

## 2022-09-22 DIAGNOSIS — R1084 Generalized abdominal pain: Secondary | ICD-10-CM

## 2022-09-22 NOTE — Telephone Encounter (Signed)
Hida scan ordered. Pt scheduled to see Joanna Hews PA 09/30/22 at 10am. Pt aware of appt and knows rad scheduling will be contacting her to set up the HIDA scan.

## 2022-09-22 NOTE — Telephone Encounter (Signed)
----- Message from Annett Fabian, RN sent at 09/21/2022  8:55 AM EDT ----- Regarding: FW: Gastritis follow up appointment Please see notes below from Dr. Meridee Score ----- Message ----- From: Lemar Lofty., MD Sent: 09/20/2022   3:22 AM EDT To: Annett Fabian, RN; Lynann Bologna, MD; # Subject: RE: Gastritis follow up appointment            Patient is mine.  So her follow-up should be with me as Dr. Chales Abrahams only performed the procedure because he had earlier availability than me in the [facility] Patient should be offered a HIDA scan for further evaluation of her symptoms. This should be performed as able. She can be seen with me as an overbook or with any of the APP's (does not have to be [place] per se). Thanks. GM ----- Message ----- From: Annett Fabian, RN Sent: 09/18/2022   1:54 PM EDT To: Lemar Lofty., MD; Lynann Bologna, MD; # Subject: RE: Gastritis follow up appointment            Dr. Meridee Score and Chales Abrahams, please see messages below.  Who needs to see the patient back?  No openings with anyone.  In an 8 week time frame unless they overbook.  ----- Message ----- From: Lucky Cowboy, RN Sent: 09/18/2022   8:37 AM EDT To: Annett Fabian, RN Subject: RE: Gastritis follow up appointment            Gunnar Fusi doesn't have any openings until mid September, unless I can book a 7 day hold in advance? She is a Dr. Meridee Score patient & I don't see where he has any appointments or nurse slots available either.  ----- Message ----- From: Annett Fabian, RN Sent: 09/18/2022   8:14 AM EDT To: Lucky Cowboy, RN Subject: FW: Gastritis follow up appointment            Glendora Score, do you have any place you can overbook this patient? ----- Message ----- From: Lynnda Child, RN Sent: 09/16/2022  12:59 PM EDT To: Annett Fabian, RN; Raelyn Number, RN Subject: FW: Gastritis follow up appointment            Lavonna Rua,   With this patient, Susie said she went to Dr. Chales Abrahams and told him that he had no  appointments (nor did Renae Fickle) so Dr. Chales Abrahams told her to send a message to his nurse because this patient needed to be seen before the 8 weeks. What do we do in this situation?? ----- Message ----- From: Georgina Peer, RN Sent: 09/16/2022   7:49 AM EDT To: Raelyn Number, RN; Lynnda Child, RN; # Subject: FW: Gastritis follow up appointment            I don't know what to do in this situation--this is becoming a tag game and I don't have access to schedule/override appointments to fit patients in. We have been told to message the MD's nurse to schedule when we are unable to and now they are just forwarding it back to [place] with no resolution.  Please advise.  ----- Message ----- From: Lucky Cowboy, RN Sent: 09/10/2022  11:58 AM EDT To: Georgina Peer, RN Subject: RE: Gastritis follow up appointment            Hi Rinaldo Cloud, we do not have a spot to put patient in until later than 8 weeks & are unable to schedule in those 7 day holds.  ----- Message ----- From:  Georgina Peer, RN Sent: 09/08/2022   2:34 PM EDT To: Emeline Darling, RN Subject: Gastritis follow up appointment                Dr. Chales Abrahams wants patient seen with [facility] 6-8 weeks post EGD for gastritis.  Unable to schedule within time period due to "7 Day Hold slots."  Can you schedule patient?

## 2022-09-25 ENCOUNTER — Encounter: Payer: Self-pay | Admitting: Gastroenterology

## 2022-09-29 ENCOUNTER — Ambulatory Visit: Payer: BC Managed Care – PPO | Admitting: Gastroenterology

## 2022-09-30 ENCOUNTER — Encounter: Payer: Self-pay | Admitting: Physician Assistant

## 2022-09-30 ENCOUNTER — Ambulatory Visit (INDEPENDENT_AMBULATORY_CARE_PROVIDER_SITE_OTHER): Payer: BC Managed Care – PPO | Admitting: Physician Assistant

## 2022-09-30 VITALS — BP 112/86 | HR 66 | Ht 62.0 in | Wt 137.0 lb

## 2022-09-30 DIAGNOSIS — R112 Nausea with vomiting, unspecified: Secondary | ICD-10-CM | POA: Diagnosis not present

## 2022-09-30 DIAGNOSIS — G8929 Other chronic pain: Secondary | ICD-10-CM

## 2022-09-30 DIAGNOSIS — R634 Abnormal weight loss: Secondary | ICD-10-CM | POA: Diagnosis not present

## 2022-09-30 DIAGNOSIS — R1013 Epigastric pain: Secondary | ICD-10-CM | POA: Diagnosis not present

## 2022-09-30 MED ORDER — ONDANSETRON HCL 4 MG PO TABS: 4.0000 mg | ORAL_TABLET | Freq: Four times a day (QID) | ORAL | 0 refills | Status: AC | PRN

## 2022-09-30 NOTE — Progress Notes (Signed)
Chief Complaint: Loss of weight, nausea, vomiting and epigastric pain  HPI:    Dawn Brooks is a 42 year old African-American female, signed to Dr. Meridee Score, with past medical history as listed below including anxiety, depression and multiple others, who turns to clinic today for follow-up of weight loss, nausea, vomiting and epigastric pain.    04/2021 CTAP for abdominal pain anemia was negative.    04/2021 EGD for heartburn IDA with nummular lesions in the esophagus proximally, 1 cm hiatal hernia, single gastric polyp, erythematous mucosa in the gastric body.  Biopsy showed reactive gastropathy with foveolar hyperplasia and mild chronic gastritis.  Benign fundic gland polyps.    04/2021 colonoscopy with sessile serrated polyps.  Repeat recommended in 3 years.    02/2022 right upper quadrant ultrasound for abdominal pain was normal.    08/28/2022 patient seen in clinic by Willette Cluster and at that time discussed nausea for 6 months with a weight loss of 21 pounds reported since March.  At that time continued on Zofran and resumed Prilosec before breakfast and scheduled for an EGD.    09/08/2022 EGD with mild gastritis and otherwise normal.  Pathology showed mild reactive gastropathy.    Patient scheduled 10/13/2022 for HIDA scan with [facility].    Today, patient presents to clinic and tells me that she has not been able to gain any weight over the past month and a half, but has not lost any more significant weight since being seen last in our clinic.  She tells me the nausea is slightly better but is somewhat unpredictable and still brought on by different smells.  Sometimes she will have an appetite and other times she does not at all.  She tries to supplement with Boost/Ensure as able.  Does report a lot of stress and anxiety in her life and is following with a psychiatrist in regards to this to manage her medications.  She is tearful during time of my interview with her.  Continues with some epigastric  discomfort.    Denies fever, chills or symptoms that awaken her from sleep.     Past Medical History:  Diagnosis Date   Anemia    Anxiety    Arthritis    Depression    GERD (gastroesophageal reflux disease)    Heart murmur     Past Surgical History:  Procedure Laterality Date   CESAREAN SECTION  2016   WISDOM TOOTH EXTRACTION  1998   WRIST SURGERY  06/14/2016    Current Outpatient Medications  Medication Sig Dispense Refill   Ascorbic Acid (VITAMIN C) 1000 MG tablet Take 1,000 mg by mouth daily.     Cholecalciferol 25 MCG (1000 UT) tablet Take 1,000 Units by mouth daily.     Cyanocobalamin 1000 MCG TBCR Take 1 tablet by mouth daily.     dicyclomine (BENTYL) 20 MG tablet Take 1 tablet (20 mg total) by mouth 3 (three) times daily as needed for spasms (abdominal pain). 50 tablet 0   DULoxetine (CYMBALTA) 30 MG capsule Take 30 mg by mouth every morning.     escitalopram (LEXAPRO) 20 MG tablet Take 20 mg by mouth daily.     Fluocinolone Acetonide Scalp 0.01 % OIL Apply 1 application topically as needed (itching).     gabapentin (NEURONTIN) 300 MG capsule Take 300 mg by mouth 2 (two) times daily.     hydrOXYzine (VISTARIL) 25 MG capsule Take 25-50 mg by mouth at bedtime.     ibuprofen (ADVIL) 200 MG tablet  Take 400 mg by mouth every 6 (six) hours as needed for headache or moderate pain.     lidocaine (LIDODERM) 5 % Place 1 patch onto the skin daily as needed (pain). Remove & Discard patch within 12 hours or as directed by MD     methocarbamol (ROBAXIN) 500 MG tablet Take 1 tablet (500 mg total) by mouth every 8 (eight) hours as needed for muscle spasms. 8 tablet 0   mirtazapine (REMERON) 30 MG tablet Take 30 mg by mouth at bedtime.     Multiple Vitamin (MULTI-VITAMIN) tablet Take 1 tablet by mouth daily.     omeprazole (PRILOSEC) 40 MG capsule TAKE 1 CAPSULE BY MOUTH EVERY DAY BEFORE BREAKFAST 90 capsule 1   ondansetron (ZOFRAN) 4 MG tablet Take 1 tablet (4 mg total) by mouth every 6  (six) hours as needed for nausea or vomiting. 30 tablet 0   ondansetron (ZOFRAN-ODT) 4 MG disintegrating tablet Take 1 tablet (4 mg total) by mouth 2 (two) times daily as needed. 40 tablet 2   traZODone (DESYREL) 50 MG tablet Take 50-150 mg by mouth at bedtime.     valACYclovir (VALTREX) 500 MG tablet Take 500 mg by mouth daily as needed (outbreaks).     No current facility-administered medications for this visit.    Allergies as of 09/30/2022   (No Known Allergies)    Family History  Problem Relation Age of Onset   Prostate cancer Father    Breast cancer Paternal Aunt    Diabetes Maternal Grandmother    Diabetes Maternal Grandfather    Colon cancer Neg Hx    Esophageal cancer Neg Hx    Liver cancer Neg Hx    Pancreatic cancer Neg Hx    Stomach cancer Neg Hx    Rectal cancer Neg Hx     Social History   Socioeconomic History   Marital status: Single    Spouse name: Not on file   Number of children: 1   Years of education: Not on file   Highest education level: Not on file  Occupational History   Not on file  Tobacco Use   Smoking status: Never   Smokeless tobacco: Never  Vaping Use   Vaping status: Never Used  Substance and Sexual Activity   Alcohol use: Never   Drug use: Not Currently    Types: Marijuana   Sexual activity: Not on file  Other Topics Concern   Not on file  Social History Narrative   Not on file   Social Determinants of Health   Financial Resource Strain: Not on file  Food Insecurity: Medium Risk (07/30/2022)   [facility]   Food vital sign    Within the past 12 months, you worried that your food would run out before you got money to buy more: Sometimes true    Within the past 12 months, the food you bought just didn't last and you didn't have money to get more. : Sometimes true  Transportation Needs: No Transportation Needs (07/30/2022)   [facility]   Transportation    In the past  12 months, has lack of reliable transportation kept you from medical appointments, meetings, work or from getting things needed for daily living? : No  Physical Activity: Not on file  Stress: Not on file  Social Connections: Not on file  Intimate Partner Violence: Not on file    Review of Systems:    Constitutional: No weight  loss, fever or chills Cardiovascular: No chest pain Respiratory: No SOB  Gastrointestinal: See HPI and otherwise negative   Physical Exam:  Vital signs: BP 112/86   Pulse 66   Ht 5\' 2"  (1.575 m)   Wt 137 lb (62.1 kg)   LMP 08/28/2022 (Exact Date)   SpO2 99%   BMI 25.06 kg/m   Constitutional:   Pleasant tearful AA female appears to be in NAD, Well developed, Well nourished, alert and cooperative Respiratory: Respirations even and unlabored. Lungs clear to auscultation bilaterally.   No wheezes, crackles, or rhonchi.  Cardiovascular: Normal S1, S2. No MRG. Regular rate and rhythm. No peripheral edema, cyanosis or pallor.  Gastrointestinal:  Soft, nondistended, nontender. No rebound or guarding. Normal bowel sounds. No appreciable masses or hepatomegaly. Rectal:  Not performed.  Psychiatric:  Demonstrates good judgement and reason without abnormal affect or behaviors.  RELEVANT LABS AND IMAGING: CBC    Component Value Date/Time   WBC 5.0 02/18/2022 0945   RBC 4.11 02/18/2022 0945   HGB 12.3 02/18/2022 0945   HCT 35.9 (L) 02/18/2022 0945   PLT 365.0 02/18/2022 0945   MCV 87.3 02/18/2022 0945   MCH 27.5 05/01/2021 2209   MCHC 34.3 02/18/2022 0945   RDW 13.9 02/18/2022 0945   LYMPHSABS 1.4 02/18/2022 0945   MONOABS 0.4 02/18/2022 0945   EOSABS 0.1 02/18/2022 0945   BASOSABS 0.0 02/18/2022 0945    CMP     Component Value Date/Time   NA 137 02/18/2022 0945   K 3.5 02/18/2022 0945   CL 103 02/18/2022 0945   CO2 28 02/18/2022 0945   GLUCOSE 101 (H) 02/18/2022 0945   BUN 8 02/18/2022 0945   CREATININE 0.63 02/18/2022 0945   CALCIUM 8.8 02/18/2022  0945   PROT 6.9 02/18/2022 0945   ALBUMIN 4.1 02/18/2022 0945   AST 11 02/18/2022 0945   ALT 12 02/18/2022 0945   ALKPHOS 46 02/18/2022 0945   BILITOT 0.2 02/18/2022 0945   GFRNONAA >60 05/01/2021 2209    Assessment: 1.  Nausea with vomiting: With loss of appetite off-and-on and epigastric pain, initially with weight loss of around 17 pounds over a 3 to 78-month period, now stable but feels like she is unable to gain weight, gastritis seen on recent EGD, HIDA scan ordered for further eval of gallbladder, does have a lot of stress and anxiety/depression; consider most likely functional dyspepsia versus gallbladder etiology 2.  Appetite loss 3.  Epigastric pain  Plan: 1.  Encouraged the patient to continue to follow with her psychiatrist.  I think that probably a lot of her symptoms are stemming from psychiatric origin. 2.  Patient already has a HIDA scan scheduled for the end of July.  We briefly discussed a CT if this was normal, though she did have a normal CT just last year. 3.  Patient to follow in clinic per recommendations after HIDA scan as above. 4.  Discussed scheduling Zofran every 6 hours and eating 20 to 30 minutes after taking this.  Continue boost/Ensure supplement.  Dawn Meeker, PA-C [facility] 09/30/2022, 10:09 AM  Cc: Dawn Brazil, DO

## 2022-09-30 NOTE — Progress Notes (Signed)
Attending Physician's Attestation   I have reviewed the chart.   I agree with the Advanced Practitioner's note, impression, and recommendations with any updates as below. If [facility] HIDA is negative, then repeat cross-sectional imaging with CT does make sense.   Corliss Parish, MD [facility] Advanced Endoscopy Office # [phone]

## 2022-09-30 NOTE — Patient Instructions (Signed)
We will send your office note and procedure report to [facility] per your request  We have sent the following medications to your pharmacy for you to pick up at your convenience: Zofran  Keep scheduled HIDA Scan appointment  _______________________________________________________  If your blood pressure at your visit was 140/90 or greater, please contact your primary care physician to follow up on this.  _______________________________________________________  If you are age 9 or older, your body mass index should be between 23-30. Your Body mass index is 25.06 kg/m. If this is out of the aforementioned range listed, please consider follow up with your Primary Care Provider.  If you are age 37 or younger, your body mass index should be between 19-25. Your Body mass index is 25.06 kg/m. If this is out of the aformentioned range listed, please consider follow up with your Primary Care Provider.   ________________________________________________________  The [place] [facility] providers would like to encourage you to use [facility] to communicate with providers for non-urgent requests or questions.  Due to long hold times on the telephone, sending your provider a message by [facility] may be a faster and more efficient way to get a response.  Please allow 48 business hours for a response.  Please remember that this is for non-urgent requests.  _______________________________________________________   I appreciate the  opportunity to care for you  Thank You   Jacelyn Grip

## 2022-10-02 ENCOUNTER — Telehealth: Payer: Self-pay | Admitting: Physician Assistant

## 2022-10-02 NOTE — Telephone Encounter (Signed)
PT is asking that her EGD report from 6/25 be sent to her pcp, Annabell Sabal; fax [phone]

## 2022-10-02 NOTE — Telephone Encounter (Signed)
Sent copy to PCP.

## 2022-10-08 NOTE — Telephone Encounter (Signed)
PT is calling back because the information we sent needed to include follow up summaries along with any other upcoming testing that needs to be done. Please fax to Annabell Sabal [phone]

## 2022-10-08 NOTE — Telephone Encounter (Signed)
Faxed records to patients PCP.

## 2022-10-12 ENCOUNTER — Other Ambulatory Visit: Payer: Self-pay | Admitting: Physician Assistant

## 2022-10-12 DIAGNOSIS — F32A Depression, unspecified: Secondary | ICD-10-CM | POA: Diagnosis not present

## 2022-10-12 DIAGNOSIS — R5383 Other fatigue: Secondary | ICD-10-CM | POA: Diagnosis not present

## 2022-10-12 DIAGNOSIS — G4700 Insomnia, unspecified: Secondary | ICD-10-CM | POA: Diagnosis not present

## 2022-10-12 DIAGNOSIS — R630 Anorexia: Secondary | ICD-10-CM | POA: Diagnosis not present

## 2022-10-12 DIAGNOSIS — R634 Abnormal weight loss: Secondary | ICD-10-CM | POA: Diagnosis not present

## 2022-10-12 DIAGNOSIS — R011 Cardiac murmur, unspecified: Secondary | ICD-10-CM | POA: Diagnosis not present

## 2022-10-12 DIAGNOSIS — F419 Anxiety disorder, unspecified: Secondary | ICD-10-CM | POA: Diagnosis not present

## 2022-10-12 DIAGNOSIS — R768 Other specified abnormal immunological findings in serum: Secondary | ICD-10-CM | POA: Diagnosis not present

## 2022-10-12 DIAGNOSIS — K449 Diaphragmatic hernia without obstruction or gangrene: Secondary | ICD-10-CM | POA: Diagnosis not present

## 2022-10-12 DIAGNOSIS — R519 Headache, unspecified: Secondary | ICD-10-CM | POA: Diagnosis not present

## 2022-10-12 DIAGNOSIS — D509 Iron deficiency anemia, unspecified: Secondary | ICD-10-CM | POA: Diagnosis not present

## 2022-10-13 ENCOUNTER — Ambulatory Visit (HOSPITAL_COMMUNITY)
Admission: RE | Admit: 2022-10-13 | Discharge: 2022-10-13 | Disposition: A | Discharge status: Home / Self Care | Payer: BC Managed Care – PPO | Source: Ambulatory Visit | Attending: Gastroenterology | Admitting: Gastroenterology

## 2022-10-13 DIAGNOSIS — R1084 Generalized abdominal pain: Secondary | ICD-10-CM

## 2022-10-13 DIAGNOSIS — R109 Unspecified abdominal pain: Secondary | ICD-10-CM | POA: Diagnosis not present

## 2022-10-13 DIAGNOSIS — R110 Nausea: Secondary | ICD-10-CM

## 2022-10-13 DIAGNOSIS — R634 Abnormal weight loss: Secondary | ICD-10-CM

## 2022-10-13 MED ORDER — TECHNETIUM TC 99M MEBROFENIN IV KIT
5.2100 | PACK | Freq: Once | INTRAVENOUS | Status: AC
  Administered 2022-10-13: 5.21 via INTRAVENOUS

## 2022-10-21 ENCOUNTER — Ambulatory Visit: Payer: BC Managed Care – PPO | Admitting: Physician Assistant

## 2022-10-27 ENCOUNTER — Telehealth: Payer: Self-pay | Admitting: Physician Assistant

## 2022-10-27 DIAGNOSIS — R110 Nausea: Secondary | ICD-10-CM

## 2022-10-27 DIAGNOSIS — R634 Abnormal weight loss: Secondary | ICD-10-CM

## 2022-10-27 DIAGNOSIS — G8929 Other chronic pain: Secondary | ICD-10-CM

## 2022-10-27 NOTE — Telephone Encounter (Signed)
Inbound call from patient stating she is currently on a leave of absence from work. States absence team needs recent office notes and procedure reports faxed to [phone]. Patient states her leave number also needs to be attached: [id number]. Patient also requesting a call back to discuss next steps after last procedure in June. Please advise, thank you.

## 2022-10-28 NOTE — Telephone Encounter (Signed)
Called and spoke with patient to inform her that we are faxing records to her job as requested for her leave of absence. Patient mentioned that she continues to have a little bit of nausea, no vomiting. Pt did mention that her appetite is diminished, she is not losing weight as fast as she was but she is not gaining weight either. It was previously recommended that if HIDA scan was negative that we proceed with repeat CT scan. Pt wishes to proceed, pt knows to expect a call from [facility] radiology scheduling to schedule her CT appt. Pt verbalized understanding and had no concerns at the end of the call.   6/14 and 7/17 office notes, and 6/25 procedure and pathology results printed and faxed to patient's job as requested.   CT order in epic. Secure staff message sent to radiology scheduling to contact patient to set up appt.

## 2022-11-03 NOTE — Telephone Encounter (Signed)
CT scan scheduled for 11/09/22 at 1:30 pm

## 2022-11-09 ENCOUNTER — Ambulatory Visit (HOSPITAL_COMMUNITY)
Admission: RE | Admit: 2022-11-09 | Discharge: 2022-11-09 | Disposition: A | Discharge status: Home / Self Care | Payer: BC Managed Care – PPO | Source: Ambulatory Visit | Attending: Physician Assistant | Admitting: Physician Assistant

## 2022-11-09 DIAGNOSIS — R110 Nausea: Secondary | ICD-10-CM

## 2022-11-09 DIAGNOSIS — G8929 Other chronic pain: Secondary | ICD-10-CM

## 2022-11-09 DIAGNOSIS — R1013 Epigastric pain: Secondary | ICD-10-CM | POA: Diagnosis not present

## 2022-11-09 DIAGNOSIS — R634 Abnormal weight loss: Secondary | ICD-10-CM

## 2022-11-09 MED ORDER — SODIUM CHLORIDE (PF) 0.9 % IJ SOLN
INTRAMUSCULAR | Status: AC
  Filled 2022-11-09: qty 50

## 2022-11-09 MED ORDER — IOHEXOL 300 MG/ML  SOLN
100.0000 mL | Freq: Once | INTRAMUSCULAR | Status: AC | PRN
  Administered 2022-11-09: 100 mL via INTRAVENOUS

## 2022-11-11 DIAGNOSIS — M545 Low back pain, unspecified: Secondary | ICD-10-CM | POA: Diagnosis not present

## 2022-11-12 DIAGNOSIS — D251 Intramural leiomyoma of uterus: Secondary | ICD-10-CM | POA: Diagnosis not present

## 2022-11-12 DIAGNOSIS — D250 Submucous leiomyoma of uterus: Secondary | ICD-10-CM | POA: Diagnosis not present

## 2022-12-23 DIAGNOSIS — Z01419 Encounter for gynecological examination (general) (routine) without abnormal findings: Secondary | ICD-10-CM | POA: Diagnosis not present

## 2023-01-28 ENCOUNTER — Ambulatory Visit (INDEPENDENT_AMBULATORY_CARE_PROVIDER_SITE_OTHER): Payer: BC Managed Care – PPO | Admitting: Physician Assistant

## 2023-01-28 ENCOUNTER — Encounter: Payer: Self-pay | Admitting: Physician Assistant

## 2023-01-28 VITALS — BP 120/64 | HR 66 | Ht 61.0 in | Wt 146.2 lb

## 2023-01-28 DIAGNOSIS — R1013 Epigastric pain: Secondary | ICD-10-CM

## 2023-01-28 DIAGNOSIS — Z860100 Personal history of colon polyps, unspecified: Secondary | ICD-10-CM | POA: Diagnosis not present

## 2023-01-28 DIAGNOSIS — K2970 Gastritis, unspecified, without bleeding: Secondary | ICD-10-CM

## 2023-01-28 DIAGNOSIS — D509 Iron deficiency anemia, unspecified: Secondary | ICD-10-CM

## 2023-01-28 MED ORDER — OMEPRAZOLE 40 MG PO CPDR: 40.0000 mg | DELAYED_RELEASE_CAPSULE | Freq: Every day | ORAL | 3 refills | Status: AC

## 2023-01-28 MED ORDER — DICYCLOMINE HCL 10 MG PO CAPS: 10.0000 mg | ORAL_CAPSULE | Freq: Four times a day (QID) | ORAL | 8 refills | Status: AC | PRN

## 2023-01-28 NOTE — Patient Instructions (Signed)
_______________________________________________________  If your blood pressure at your visit was 140/90 or greater, please contact your primary care physician to follow up on this.  _______________________________________________________  If you are age 44 or older, your body mass index should be between 23-30. Your Body mass index is 27.63 kg/m. If this is out of the aforementioned range listed, please consider follow up with your Primary Care Provider.  If you are age 30 or younger, your body mass index should be between 19-25. Your Body mass index is 27.63 kg/m. If this is out of the aformentioned range listed, please consider follow up with your Primary Care Provider.   ________________________________________________________  The [place] [facility] providers would like to encourage you to use [facility] to communicate with providers for non-urgent requests or questions.  Due to long hold times on the telephone, sending your provider a message by [facility] may be a faster and more efficient way to get a response.  Please allow 48 business hours for a response.  Please remember that this is for non-urgent requests.  _______________________________________________________  We have sent the following medications to your pharmacy for you to pick up at your convenience: Bentyl Omeprazole  Please follow up in 12 months. Give [place] a call at [phone] to schedule an appointment.  It was a pleasure to see you today!  Thank you for trusting me with your gastrointestinal care!

## 2023-01-31 ENCOUNTER — Encounter: Payer: Self-pay | Admitting: Physician Assistant

## 2023-01-31 NOTE — Progress Notes (Signed)
Subjective:    Patient ID: Dawn Brooks, female    DOB: 1980/12/16, 42 y.o.   MRN: [id number]  HPI Dawn Brooks is a pleasant 42 year old female, established with Dr. Meridee Score.  She was last seen in in July 2024 by Hyacinth Meeker, PA-C at that time with complaints of nausea vomiting upper abdominal pain and some mild weight loss. She also has history of iron deficiency anemia and is followed by hematology for that.  This is felt secondary to menorrhagia. She had undergone EGD in June 2024 for the same complaint and was found to have mild gastritis but otherwise negative exam.  Path showed mild reactive gastropathy negative for H. pylori. She then underwent [facility] HIDA scan which was read as normal. CT of the abdomen and pelvis was done in August 2024 with contrast and showed a bulky uterus consistent with fibroids and otherwise was unremarkable. She had undergone colonoscopy in February 2023 and had 4 sessile serrated polyps removed and is indicated for 3-year interval follow-up. At this time she is not having any nausea or vomiting, that completely resolved, her weight has been stable and she is actually gained few pounds back.  She still occasionally has pain in the upper abdomen which she describes as sharp and jabbing in nature usually brief and usually occurring when she is bent over, or bending in someway.  She had backed off on exercising because of this pain and is asking about restarting. She continues on omeprazole 40 mg p.o. every morning, had not been using any Bentyl recently.  She does not have any lower GI complaints currently no problems with bowel movements, no melena or hematochezia. Review of Systems Pertinent positive and negative review of systems were noted in the above HPI section.  All other review of systems was otherwise negative.   Outpatient Encounter Medications as of 01/28/2023  Medication Sig   Ascorbic Acid (VITAMIN C) 1000 MG tablet Take 1,000 mg by mouth daily.    Cholecalciferol 25 MCG (1000 UT) tablet Take 1,000 Units by mouth daily.   dicyclomine (BENTYL) 10 MG capsule Take 1 capsule (10 mg total) by mouth every 6 (six) hours as needed for spasms.   dicyclomine (BENTYL) 20 MG tablet TAKE 1 TABLET (20 MG TOTAL) BY MOUTH 3 (THREE) TIMES DAILY AS NEEDED FOR SPASMS (ABDOMINAL PAIN).   escitalopram (LEXAPRO) 20 MG tablet Take 20 mg by mouth daily.   Fluocinolone Acetonide Scalp 0.01 % OIL Apply 1 application topically as needed (itching).   lidocaine (LIDODERM) 5 % Place 1 patch onto the skin daily as needed (pain). Remove & Discard patch within 12 hours or as directed by MD   mirtazapine (REMERON) 30 MG tablet Take 30 mg by mouth at bedtime.   traZODone (DESYREL) 50 MG tablet Take 50-150 mg by mouth at bedtime.   [DISCONTINUED] omeprazole (PRILOSEC) 40 MG capsule TAKE 1 CAPSULE BY MOUTH EVERY DAY BEFORE BREAKFAST   Cyanocobalamin 1000 MCG TBCR Take 1 tablet by mouth daily. (Patient not taking: Reported on 01/28/2023)   DULoxetine (CYMBALTA) 30 MG capsule Take 30 mg by mouth every morning. (Patient not taking: Reported on 01/28/2023)   gabapentin (NEURONTIN) 300 MG capsule Take 300 mg by mouth 2 (two) times daily.   hydrOXYzine (VISTARIL) 25 MG capsule Take 25-50 mg by mouth at bedtime. (Patient not taking: Reported on 01/28/2023)   ibuprofen (ADVIL) 200 MG tablet Take 400 mg by mouth every 6 (six) hours as needed for headache or moderate pain. (Patient not taking:  Reported on 01/28/2023)   methocarbamol (ROBAXIN) 500 MG tablet Take 1 tablet (500 mg total) by mouth every 8 (eight) hours as needed for muscle spasms. (Patient not taking: Reported on 01/28/2023)   Multiple Vitamin (MULTI-VITAMIN) tablet Take 1 tablet by mouth daily. (Patient not taking: Reported on 01/28/2023)   omeprazole (PRILOSEC) 40 MG capsule Take 1 capsule (40 mg total) by mouth daily.   ondansetron (ZOFRAN) 4 MG tablet Take 1 tablet (4 mg total) by mouth every 6 (six) hours as needed for  nausea or vomiting. (Patient not taking: Reported on 01/28/2023)   ondansetron (ZOFRAN-ODT) 4 MG disintegrating tablet Take 1 tablet (4 mg total) by mouth 2 (two) times daily as needed. (Patient not taking: Reported on 01/28/2023)   valACYclovir (VALTREX) 500 MG tablet Take 500 mg by mouth daily as needed (outbreaks). (Patient not taking: Reported on 01/28/2023)   No facility-administered encounter medications on file as of 01/28/2023.   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   Social History   Socioeconomic History   Marital status: Single    Spouse name: Not on file   Number of children: 1   Years of education: Not on file   Highest education level: Not on file  Occupational History   Not on file  Tobacco Use   Smoking status: Never   Smokeless tobacco: Never  Vaping Use   Vaping status: Never Used  Substance and Sexual Activity   Alcohol use: Never   Drug use: Not Currently    Types: Marijuana   Sexual activity: Not on file  Other Topics Concern   Not on file  Social History Narrative   Not on file   Social Determinants of Health   Financial Resource Strain: Not on file  Food Insecurity: Medium Risk (07/30/2022)   [facility]   Hunger Vital Sign    Worried About Running Out of Food in the Last Year: Sometimes true    Ran Out of Food in the Last Year: Sometimes true  Transportation Needs: No Transportation Needs (07/30/2022)   [facility]   Transportation    In the past 12 months, has lack of reliable transportation kept you from medical appointments, meetings, work or from getting things needed for daily living? : No  Physical Activity: Not on file  Stress: Not on file  Social Connections: Not on file  Intimate Partner Violence: Not on file    Dawn Brooks's family history includes Breast cancer in her paternal  aunt; Diabetes in her maternal grandfather and maternal grandmother; Prostate cancer in her father.      Objective:    Vitals:   01/28/23 1402  BP: 120/64  Pulse: 66    Physical Exam. Well-developed well-nourished AA female   in no acute distress.  Height, [id number] BMI 27.6  HEENT; nontraumatic normocephalic, EOMI, PE R LA, sclera anicteric. Oropharynx;not examined Neck; supple, no JVD Cardiovascular; regular rate and rhythm with S1-S2, no murmur rub or gallop Pulmonary; Clear bilaterally Abdomen; soft, nontender,no definite ventral hernia or diastasis, nondistended, no palpable mass or hepatosplenomegaly, bowel sounds are active Rectal;not done Skin; benign exam, no jaundice rash or appreciable lesions Extremities; no clubbing cyanosis or edema skin warm and dry Neuro/Psych; alert and oriented x4, grossly nonfocal mood and affect appropriate        Assessment & Plan:   #55 42 year old female  who comes in today for follow-up after undergoing workup a few months ago for epigastric pain nausea and vomiting and some mild weight loss. Overall she is doing well, nausea and vomiting had resolved, she has gained a few pounds back and is not having any ongoing upper abdominal pain.  She describes fleeting pain which she describes as sharp and jabbing in nature and usually occurring when she is bent over. CT did not show any evidence of abdominal wall hernia, she is nontender on exam currently.  I suspect this is more of a musculoskeletal type pain.  #2 gastritis/GERD-stable on omeprazole #3 history of sessile serrated colon polyps on colonoscopy February 2023 will be due for follow-up February 2026 #4.  Anxiety/depression #5 history of iron deficiency anemia-followed by hematology has appointment there next week likely secondary to menorrhagia/fibroids  Plan continue omeprazole 40 mg p.o. every morning AC breakfast Have asked her to retry Bentyl/dicyclomine 10 mg every 6 hours as  needed particularly on days if she is experiencing the sharp pain to see if this may be of benefit, it is unclear if this pain is musculoskeletal or more related to IBS. Patient was reassured that she has had a good thorough workup and we have not found any other etiologies for this pain.  Will plan to see her back in a year, happy to see her in the interim if she has any problems.  When seen next fall can get her scheduled for repeat colonoscopy with Dr.Mansouraty   Dawn Mattes Oswald Hillock PA-C 01/31/2023   Cc: Jerrell Mylar, P*

## 2023-02-01 NOTE — Progress Notes (Signed)
Attending Physician's Attestation   I have reviewed the chart.   I agree with the Advanced Practitioner's note, impression, and recommendations with any updates as below.    Emersyn Wyss Mansouraty, MD [facility] Office # [phone]  

## 2023-05-03 DIAGNOSIS — D509 Iron deficiency anemia, unspecified: Secondary | ICD-10-CM | POA: Diagnosis not present

## 2023-05-13 DIAGNOSIS — D500 Iron deficiency anemia secondary to blood loss (chronic): Secondary | ICD-10-CM | POA: Diagnosis not present

## 2023-05-13 DIAGNOSIS — D509 Iron deficiency anemia, unspecified: Secondary | ICD-10-CM | POA: Diagnosis not present

## 2023-05-21 DIAGNOSIS — R110 Nausea: Secondary | ICD-10-CM | POA: Diagnosis not present

## 2023-05-21 DIAGNOSIS — R079 Chest pain, unspecified: Secondary | ICD-10-CM | POA: Diagnosis not present

## 2023-05-21 DIAGNOSIS — R400 Somnolence: Secondary | ICD-10-CM | POA: Diagnosis not present

## 2023-05-21 DIAGNOSIS — R011 Cardiac murmur, unspecified: Secondary | ICD-10-CM | POA: Diagnosis not present

## 2023-05-21 DIAGNOSIS — R5383 Other fatigue: Secondary | ICD-10-CM | POA: Diagnosis not present

## 2023-05-21 DIAGNOSIS — R1013 Epigastric pain: Secondary | ICD-10-CM | POA: Diagnosis not present

## 2023-05-21 DIAGNOSIS — D509 Iron deficiency anemia, unspecified: Secondary | ICD-10-CM | POA: Diagnosis not present

## 2023-06-09 DIAGNOSIS — D649 Anemia, unspecified: Secondary | ICD-10-CM | POA: Diagnosis not present

## 2023-07-08 DIAGNOSIS — R079 Chest pain, unspecified: Secondary | ICD-10-CM | POA: Diagnosis not present

## 2023-07-12 DIAGNOSIS — N760 Acute vaginitis: Secondary | ICD-10-CM | POA: Diagnosis not present

## 2023-07-12 DIAGNOSIS — Z113 Encounter for screening for infections with a predominantly sexual mode of transmission: Secondary | ICD-10-CM | POA: Diagnosis not present

## 2023-07-12 DIAGNOSIS — B9689 Other specified bacterial agents as the cause of diseases classified elsewhere: Secondary | ICD-10-CM | POA: Diagnosis not present

## 2023-08-02 DIAGNOSIS — D509 Iron deficiency anemia, unspecified: Secondary | ICD-10-CM | POA: Diagnosis not present

## 2023-09-07 DIAGNOSIS — R072 Precordial pain: Secondary | ICD-10-CM | POA: Diagnosis not present

## 2023-11-01 DIAGNOSIS — D509 Iron deficiency anemia, unspecified: Secondary | ICD-10-CM | POA: Diagnosis not present

## 2023-11-28 DIAGNOSIS — N760 Acute vaginitis: Secondary | ICD-10-CM | POA: Diagnosis not present

## 2023-11-28 DIAGNOSIS — N946 Dysmenorrhea, unspecified: Secondary | ICD-10-CM | POA: Diagnosis not present

## 2023-11-28 DIAGNOSIS — R1013 Epigastric pain: Secondary | ICD-10-CM | POA: Diagnosis not present

## 2024-01-07 ENCOUNTER — Ambulatory Visit: Admitting: Physician Assistant

## 2024-02-15 ENCOUNTER — Other Ambulatory Visit: Payer: Self-pay | Admitting: Physician Assistant

## 2024-04-07 ENCOUNTER — Encounter: Payer: Self-pay | Admitting: Gastroenterology

## 2024-05-19 ENCOUNTER — Encounter

## 2024-05-30 ENCOUNTER — Encounter: Admitting: Gastroenterology
